# Patient Record
Sex: Female | Born: 1935 | Smoking: Never smoker
Health system: Southern US, Community
[De-identification: ages and names within clinical notes are randomized; demographics above are authoritative.]

## PROBLEM LIST (undated history)

## (undated) DIAGNOSIS — E7800 Pure hypercholesterolemia, unspecified: Secondary | ICD-10-CM

## (undated) DIAGNOSIS — E119 Type 2 diabetes mellitus without complications: Secondary | ICD-10-CM

## (undated) DIAGNOSIS — F0390 Unspecified dementia without behavioral disturbance: Secondary | ICD-10-CM

## (undated) DIAGNOSIS — I10 Essential (primary) hypertension: Secondary | ICD-10-CM

## (undated) HISTORY — PX: CHOLECYSTECTOMY: SHX55

---

## 2015-06-21 DIAGNOSIS — E1151 Type 2 diabetes mellitus with diabetic peripheral angiopathy without gangrene: Secondary | ICD-10-CM | POA: Diagnosis not present

## 2015-06-21 DIAGNOSIS — E1140 Type 2 diabetes mellitus with diabetic neuropathy, unspecified: Secondary | ICD-10-CM | POA: Diagnosis not present

## 2015-06-21 DIAGNOSIS — B351 Tinea unguium: Secondary | ICD-10-CM | POA: Diagnosis not present

## 2015-06-21 DIAGNOSIS — M79671 Pain in right foot: Secondary | ICD-10-CM | POA: Diagnosis not present

## 2015-07-04 DIAGNOSIS — E1165 Type 2 diabetes mellitus with hyperglycemia: Secondary | ICD-10-CM | POA: Diagnosis not present

## 2015-07-04 DIAGNOSIS — Z6826 Body mass index (BMI) 26.0-26.9, adult: Secondary | ICD-10-CM | POA: Diagnosis not present

## 2015-07-04 DIAGNOSIS — Z789 Other specified health status: Secondary | ICD-10-CM | POA: Diagnosis not present

## 2015-08-09 DIAGNOSIS — R262 Difficulty in walking, not elsewhere classified: Secondary | ICD-10-CM | POA: Diagnosis not present

## 2015-08-09 DIAGNOSIS — E7800 Pure hypercholesterolemia, unspecified: Secondary | ICD-10-CM | POA: Diagnosis not present

## 2015-08-09 DIAGNOSIS — I10 Essential (primary) hypertension: Secondary | ICD-10-CM | POA: Diagnosis not present

## 2015-08-09 DIAGNOSIS — E1165 Type 2 diabetes mellitus with hyperglycemia: Secondary | ICD-10-CM | POA: Diagnosis not present

## 2015-09-13 DIAGNOSIS — B351 Tinea unguium: Secondary | ICD-10-CM | POA: Diagnosis not present

## 2015-09-13 DIAGNOSIS — E1151 Type 2 diabetes mellitus with diabetic peripheral angiopathy without gangrene: Secondary | ICD-10-CM | POA: Diagnosis not present

## 2015-09-13 DIAGNOSIS — M79671 Pain in right foot: Secondary | ICD-10-CM | POA: Diagnosis not present

## 2015-09-13 DIAGNOSIS — E1140 Type 2 diabetes mellitus with diabetic neuropathy, unspecified: Secondary | ICD-10-CM | POA: Diagnosis not present

## 2015-09-30 DIAGNOSIS — E119 Type 2 diabetes mellitus without complications: Secondary | ICD-10-CM | POA: Diagnosis not present

## 2015-09-30 DIAGNOSIS — I10 Essential (primary) hypertension: Secondary | ICD-10-CM | POA: Diagnosis not present

## 2015-10-18 DIAGNOSIS — E7800 Pure hypercholesterolemia, unspecified: Secondary | ICD-10-CM | POA: Diagnosis not present

## 2015-10-18 DIAGNOSIS — N182 Chronic kidney disease, stage 2 (mild): Secondary | ICD-10-CM | POA: Diagnosis not present

## 2015-10-18 DIAGNOSIS — I10 Essential (primary) hypertension: Secondary | ICD-10-CM | POA: Diagnosis not present

## 2015-10-18 DIAGNOSIS — E1122 Type 2 diabetes mellitus with diabetic chronic kidney disease: Secondary | ICD-10-CM | POA: Diagnosis not present

## 2015-10-19 DIAGNOSIS — E119 Type 2 diabetes mellitus without complications: Secondary | ICD-10-CM | POA: Diagnosis not present

## 2015-10-19 DIAGNOSIS — I10 Essential (primary) hypertension: Secondary | ICD-10-CM | POA: Diagnosis not present

## 2015-11-24 DIAGNOSIS — I10 Essential (primary) hypertension: Secondary | ICD-10-CM | POA: Diagnosis not present

## 2015-11-24 DIAGNOSIS — E119 Type 2 diabetes mellitus without complications: Secondary | ICD-10-CM | POA: Diagnosis not present

## 2015-12-27 DIAGNOSIS — E1122 Type 2 diabetes mellitus with diabetic chronic kidney disease: Secondary | ICD-10-CM | POA: Diagnosis not present

## 2015-12-27 DIAGNOSIS — Z6826 Body mass index (BMI) 26.0-26.9, adult: Secondary | ICD-10-CM | POA: Diagnosis not present

## 2015-12-27 DIAGNOSIS — E559 Vitamin D deficiency, unspecified: Secondary | ICD-10-CM | POA: Diagnosis not present

## 2015-12-27 DIAGNOSIS — Z0000 Encounter for general adult medical examination without abnormal findings: Secondary | ICD-10-CM | POA: Diagnosis not present

## 2015-12-27 DIAGNOSIS — Z1389 Encounter for screening for other disorder: Secondary | ICD-10-CM | POA: Diagnosis not present

## 2015-12-27 DIAGNOSIS — Z299 Encounter for prophylactic measures, unspecified: Secondary | ICD-10-CM | POA: Diagnosis not present

## 2015-12-27 DIAGNOSIS — Z1211 Encounter for screening for malignant neoplasm of colon: Secondary | ICD-10-CM | POA: Diagnosis not present

## 2015-12-27 DIAGNOSIS — Z7189 Other specified counseling: Secondary | ICD-10-CM | POA: Diagnosis not present

## 2015-12-27 DIAGNOSIS — R5383 Other fatigue: Secondary | ICD-10-CM | POA: Diagnosis not present

## 2015-12-27 DIAGNOSIS — E7800 Pure hypercholesterolemia, unspecified: Secondary | ICD-10-CM | POA: Diagnosis not present

## 2015-12-30 DIAGNOSIS — I10 Essential (primary) hypertension: Secondary | ICD-10-CM | POA: Diagnosis not present

## 2015-12-30 DIAGNOSIS — E119 Type 2 diabetes mellitus without complications: Secondary | ICD-10-CM | POA: Diagnosis not present

## 2016-01-03 DIAGNOSIS — E1151 Type 2 diabetes mellitus with diabetic peripheral angiopathy without gangrene: Secondary | ICD-10-CM | POA: Diagnosis not present

## 2016-01-03 DIAGNOSIS — E1140 Type 2 diabetes mellitus with diabetic neuropathy, unspecified: Secondary | ICD-10-CM | POA: Diagnosis not present

## 2016-01-03 DIAGNOSIS — B351 Tinea unguium: Secondary | ICD-10-CM | POA: Diagnosis not present

## 2016-01-03 DIAGNOSIS — M79671 Pain in right foot: Secondary | ICD-10-CM | POA: Diagnosis not present

## 2016-02-07 DIAGNOSIS — I10 Essential (primary) hypertension: Secondary | ICD-10-CM | POA: Diagnosis not present

## 2016-02-07 DIAGNOSIS — E7800 Pure hypercholesterolemia, unspecified: Secondary | ICD-10-CM | POA: Diagnosis not present

## 2016-02-07 DIAGNOSIS — N182 Chronic kidney disease, stage 2 (mild): Secondary | ICD-10-CM | POA: Diagnosis not present

## 2016-02-07 DIAGNOSIS — E1122 Type 2 diabetes mellitus with diabetic chronic kidney disease: Secondary | ICD-10-CM | POA: Diagnosis not present

## 2016-03-06 DIAGNOSIS — Z23 Encounter for immunization: Secondary | ICD-10-CM | POA: Diagnosis not present

## 2016-04-11 DIAGNOSIS — E119 Type 2 diabetes mellitus without complications: Secondary | ICD-10-CM | POA: Diagnosis not present

## 2016-04-11 DIAGNOSIS — I10 Essential (primary) hypertension: Secondary | ICD-10-CM | POA: Diagnosis not present

## 2016-05-07 DIAGNOSIS — E119 Type 2 diabetes mellitus without complications: Secondary | ICD-10-CM | POA: Diagnosis not present

## 2016-05-07 DIAGNOSIS — I10 Essential (primary) hypertension: Secondary | ICD-10-CM | POA: Diagnosis not present

## 2016-05-08 DIAGNOSIS — E1140 Type 2 diabetes mellitus with diabetic neuropathy, unspecified: Secondary | ICD-10-CM | POA: Diagnosis not present

## 2016-05-08 DIAGNOSIS — E1151 Type 2 diabetes mellitus with diabetic peripheral angiopathy without gangrene: Secondary | ICD-10-CM | POA: Diagnosis not present

## 2016-05-08 DIAGNOSIS — M79671 Pain in right foot: Secondary | ICD-10-CM | POA: Diagnosis not present

## 2016-05-08 DIAGNOSIS — B351 Tinea unguium: Secondary | ICD-10-CM | POA: Diagnosis not present

## 2016-05-15 DIAGNOSIS — N182 Chronic kidney disease, stage 2 (mild): Secondary | ICD-10-CM | POA: Diagnosis not present

## 2016-05-15 DIAGNOSIS — Z6825 Body mass index (BMI) 25.0-25.9, adult: Secondary | ICD-10-CM | POA: Diagnosis not present

## 2016-05-15 DIAGNOSIS — Z299 Encounter for prophylactic measures, unspecified: Secondary | ICD-10-CM | POA: Diagnosis not present

## 2016-05-15 DIAGNOSIS — E7800 Pure hypercholesterolemia, unspecified: Secondary | ICD-10-CM | POA: Diagnosis not present

## 2016-05-15 DIAGNOSIS — E1122 Type 2 diabetes mellitus with diabetic chronic kidney disease: Secondary | ICD-10-CM | POA: Diagnosis not present

## 2016-06-12 DIAGNOSIS — I10 Essential (primary) hypertension: Secondary | ICD-10-CM | POA: Diagnosis not present

## 2016-06-12 DIAGNOSIS — E1122 Type 2 diabetes mellitus with diabetic chronic kidney disease: Secondary | ICD-10-CM | POA: Diagnosis not present

## 2016-06-12 DIAGNOSIS — E7800 Pure hypercholesterolemia, unspecified: Secondary | ICD-10-CM | POA: Diagnosis not present

## 2016-06-12 DIAGNOSIS — Z789 Other specified health status: Secondary | ICD-10-CM | POA: Diagnosis not present

## 2016-06-12 DIAGNOSIS — Z299 Encounter for prophylactic measures, unspecified: Secondary | ICD-10-CM | POA: Diagnosis not present

## 2016-06-12 DIAGNOSIS — J069 Acute upper respiratory infection, unspecified: Secondary | ICD-10-CM | POA: Diagnosis not present

## 2016-06-12 DIAGNOSIS — Z6825 Body mass index (BMI) 25.0-25.9, adult: Secondary | ICD-10-CM | POA: Diagnosis not present

## 2016-07-02 DIAGNOSIS — E119 Type 2 diabetes mellitus without complications: Secondary | ICD-10-CM | POA: Diagnosis not present

## 2016-07-02 DIAGNOSIS — I10 Essential (primary) hypertension: Secondary | ICD-10-CM | POA: Diagnosis not present

## 2016-07-25 DIAGNOSIS — I10 Essential (primary) hypertension: Secondary | ICD-10-CM | POA: Diagnosis not present

## 2016-07-25 DIAGNOSIS — E119 Type 2 diabetes mellitus without complications: Secondary | ICD-10-CM | POA: Diagnosis not present

## 2016-08-07 DIAGNOSIS — E1140 Type 2 diabetes mellitus with diabetic neuropathy, unspecified: Secondary | ICD-10-CM | POA: Diagnosis not present

## 2016-08-07 DIAGNOSIS — E1151 Type 2 diabetes mellitus with diabetic peripheral angiopathy without gangrene: Secondary | ICD-10-CM | POA: Diagnosis not present

## 2016-08-07 DIAGNOSIS — B351 Tinea unguium: Secondary | ICD-10-CM | POA: Diagnosis not present

## 2016-08-07 DIAGNOSIS — M79671 Pain in right foot: Secondary | ICD-10-CM | POA: Diagnosis not present

## 2016-08-21 DIAGNOSIS — Z789 Other specified health status: Secondary | ICD-10-CM | POA: Diagnosis not present

## 2016-08-21 DIAGNOSIS — Z6825 Body mass index (BMI) 25.0-25.9, adult: Secondary | ICD-10-CM | POA: Diagnosis not present

## 2016-08-21 DIAGNOSIS — Z299 Encounter for prophylactic measures, unspecified: Secondary | ICD-10-CM | POA: Diagnosis not present

## 2016-08-21 DIAGNOSIS — M810 Age-related osteoporosis without current pathological fracture: Secondary | ICD-10-CM | POA: Diagnosis not present

## 2016-08-21 DIAGNOSIS — K219 Gastro-esophageal reflux disease without esophagitis: Secondary | ICD-10-CM | POA: Diagnosis not present

## 2016-08-21 DIAGNOSIS — E1122 Type 2 diabetes mellitus with diabetic chronic kidney disease: Secondary | ICD-10-CM | POA: Diagnosis not present

## 2016-08-21 DIAGNOSIS — Z713 Dietary counseling and surveillance: Secondary | ICD-10-CM | POA: Diagnosis not present

## 2016-08-21 DIAGNOSIS — I10 Essential (primary) hypertension: Secondary | ICD-10-CM | POA: Diagnosis not present

## 2016-08-21 DIAGNOSIS — E7800 Pure hypercholesterolemia, unspecified: Secondary | ICD-10-CM | POA: Diagnosis not present

## 2016-08-21 DIAGNOSIS — N182 Chronic kidney disease, stage 2 (mild): Secondary | ICD-10-CM | POA: Diagnosis not present

## 2016-08-21 DIAGNOSIS — R32 Unspecified urinary incontinence: Secondary | ICD-10-CM | POA: Diagnosis not present

## 2016-10-23 DIAGNOSIS — E1151 Type 2 diabetes mellitus with diabetic peripheral angiopathy without gangrene: Secondary | ICD-10-CM | POA: Diagnosis not present

## 2016-10-23 DIAGNOSIS — E1140 Type 2 diabetes mellitus with diabetic neuropathy, unspecified: Secondary | ICD-10-CM | POA: Diagnosis not present

## 2016-10-23 DIAGNOSIS — B351 Tinea unguium: Secondary | ICD-10-CM | POA: Diagnosis not present

## 2016-10-23 DIAGNOSIS — M79671 Pain in right foot: Secondary | ICD-10-CM | POA: Diagnosis not present

## 2016-11-27 DIAGNOSIS — Z6825 Body mass index (BMI) 25.0-25.9, adult: Secondary | ICD-10-CM | POA: Diagnosis not present

## 2016-11-27 DIAGNOSIS — E1165 Type 2 diabetes mellitus with hyperglycemia: Secondary | ICD-10-CM | POA: Diagnosis not present

## 2016-11-27 DIAGNOSIS — I10 Essential (primary) hypertension: Secondary | ICD-10-CM | POA: Diagnosis not present

## 2016-11-27 DIAGNOSIS — E7800 Pure hypercholesterolemia, unspecified: Secondary | ICD-10-CM | POA: Diagnosis not present

## 2016-11-27 DIAGNOSIS — M810 Age-related osteoporosis without current pathological fracture: Secondary | ICD-10-CM | POA: Diagnosis not present

## 2016-11-27 DIAGNOSIS — Z299 Encounter for prophylactic measures, unspecified: Secondary | ICD-10-CM | POA: Diagnosis not present

## 2017-01-16 DIAGNOSIS — I10 Essential (primary) hypertension: Secondary | ICD-10-CM | POA: Diagnosis not present

## 2017-01-16 DIAGNOSIS — Z1389 Encounter for screening for other disorder: Secondary | ICD-10-CM | POA: Diagnosis not present

## 2017-01-16 DIAGNOSIS — Z7189 Other specified counseling: Secondary | ICD-10-CM | POA: Diagnosis not present

## 2017-01-16 DIAGNOSIS — Z79899 Other long term (current) drug therapy: Secondary | ICD-10-CM | POA: Diagnosis not present

## 2017-01-16 DIAGNOSIS — Z0000 Encounter for general adult medical examination without abnormal findings: Secondary | ICD-10-CM | POA: Diagnosis not present

## 2017-01-16 DIAGNOSIS — Z6824 Body mass index (BMI) 24.0-24.9, adult: Secondary | ICD-10-CM | POA: Diagnosis not present

## 2017-01-16 DIAGNOSIS — Z299 Encounter for prophylactic measures, unspecified: Secondary | ICD-10-CM | POA: Diagnosis not present

## 2017-01-16 DIAGNOSIS — Z1211 Encounter for screening for malignant neoplasm of colon: Secondary | ICD-10-CM | POA: Diagnosis not present

## 2017-01-16 DIAGNOSIS — E7800 Pure hypercholesterolemia, unspecified: Secondary | ICD-10-CM | POA: Diagnosis not present

## 2017-01-16 DIAGNOSIS — E559 Vitamin D deficiency, unspecified: Secondary | ICD-10-CM | POA: Diagnosis not present

## 2017-01-16 DIAGNOSIS — R5383 Other fatigue: Secondary | ICD-10-CM | POA: Diagnosis not present

## 2017-01-16 DIAGNOSIS — E1165 Type 2 diabetes mellitus with hyperglycemia: Secondary | ICD-10-CM | POA: Diagnosis not present

## 2017-01-24 DIAGNOSIS — I10 Essential (primary) hypertension: Secondary | ICD-10-CM | POA: Diagnosis not present

## 2017-01-24 DIAGNOSIS — E119 Type 2 diabetes mellitus without complications: Secondary | ICD-10-CM | POA: Diagnosis not present

## 2017-01-29 DIAGNOSIS — M79671 Pain in right foot: Secondary | ICD-10-CM | POA: Diagnosis not present

## 2017-01-29 DIAGNOSIS — E1151 Type 2 diabetes mellitus with diabetic peripheral angiopathy without gangrene: Secondary | ICD-10-CM | POA: Diagnosis not present

## 2017-01-29 DIAGNOSIS — B351 Tinea unguium: Secondary | ICD-10-CM | POA: Diagnosis not present

## 2017-01-29 DIAGNOSIS — E1140 Type 2 diabetes mellitus with diabetic neuropathy, unspecified: Secondary | ICD-10-CM | POA: Diagnosis not present

## 2017-02-02 DIAGNOSIS — Z79899 Other long term (current) drug therapy: Secondary | ICD-10-CM | POA: Diagnosis not present

## 2017-02-02 DIAGNOSIS — S52502A Unspecified fracture of the lower end of left radius, initial encounter for closed fracture: Secondary | ICD-10-CM | POA: Diagnosis not present

## 2017-02-02 DIAGNOSIS — S52592A Other fractures of lower end of left radius, initial encounter for closed fracture: Secondary | ICD-10-CM | POA: Diagnosis not present

## 2017-02-02 DIAGNOSIS — E7800 Pure hypercholesterolemia, unspecified: Secondary | ICD-10-CM | POA: Diagnosis not present

## 2017-02-02 DIAGNOSIS — W1839XA Other fall on same level, initial encounter: Secondary | ICD-10-CM | POA: Diagnosis not present

## 2017-02-02 DIAGNOSIS — K219 Gastro-esophageal reflux disease without esophagitis: Secondary | ICD-10-CM | POA: Diagnosis not present

## 2017-02-02 DIAGNOSIS — Z7984 Long term (current) use of oral hypoglycemic drugs: Secondary | ICD-10-CM | POA: Diagnosis not present

## 2017-02-02 DIAGNOSIS — I10 Essential (primary) hypertension: Secondary | ICD-10-CM | POA: Diagnosis not present

## 2017-02-02 DIAGNOSIS — S62102A Fracture of unspecified carpal bone, left wrist, initial encounter for closed fracture: Secondary | ICD-10-CM | POA: Diagnosis not present

## 2017-02-02 DIAGNOSIS — E119 Type 2 diabetes mellitus without complications: Secondary | ICD-10-CM | POA: Diagnosis not present

## 2017-02-05 DIAGNOSIS — K219 Gastro-esophageal reflux disease without esophagitis: Secondary | ICD-10-CM | POA: Diagnosis not present

## 2017-02-05 DIAGNOSIS — E119 Type 2 diabetes mellitus without complications: Secondary | ICD-10-CM | POA: Diagnosis not present

## 2017-02-05 DIAGNOSIS — S5292XA Unspecified fracture of left forearm, initial encounter for closed fracture: Secondary | ICD-10-CM | POA: Diagnosis not present

## 2017-02-05 DIAGNOSIS — S52502A Unspecified fracture of the lower end of left radius, initial encounter for closed fracture: Secondary | ICD-10-CM | POA: Diagnosis not present

## 2017-02-05 DIAGNOSIS — Z794 Long term (current) use of insulin: Secondary | ICD-10-CM | POA: Diagnosis not present

## 2017-02-05 DIAGNOSIS — Z79899 Other long term (current) drug therapy: Secondary | ICD-10-CM | POA: Diagnosis not present

## 2017-02-05 DIAGNOSIS — I10 Essential (primary) hypertension: Secondary | ICD-10-CM | POA: Diagnosis not present

## 2017-02-05 DIAGNOSIS — S52532A Colles' fracture of left radius, initial encounter for closed fracture: Secondary | ICD-10-CM | POA: Diagnosis not present

## 2017-02-05 DIAGNOSIS — E7800 Pure hypercholesterolemia, unspecified: Secondary | ICD-10-CM | POA: Diagnosis not present

## 2017-02-05 DIAGNOSIS — W1839XA Other fall on same level, initial encounter: Secondary | ICD-10-CM | POA: Diagnosis not present

## 2017-02-12 DIAGNOSIS — S52602D Unspecified fracture of lower end of left ulna, subsequent encounter for closed fracture with routine healing: Secondary | ICD-10-CM | POA: Diagnosis not present

## 2017-02-12 DIAGNOSIS — S52502D Unspecified fracture of the lower end of left radius, subsequent encounter for closed fracture with routine healing: Secondary | ICD-10-CM | POA: Diagnosis not present

## 2017-02-14 DIAGNOSIS — E119 Type 2 diabetes mellitus without complications: Secondary | ICD-10-CM | POA: Diagnosis not present

## 2017-02-14 DIAGNOSIS — I10 Essential (primary) hypertension: Secondary | ICD-10-CM | POA: Diagnosis not present

## 2017-03-05 DIAGNOSIS — S52602D Unspecified fracture of lower end of left ulna, subsequent encounter for closed fracture with routine healing: Secondary | ICD-10-CM | POA: Diagnosis not present

## 2017-03-05 DIAGNOSIS — S52502D Unspecified fracture of the lower end of left radius, subsequent encounter for closed fracture with routine healing: Secondary | ICD-10-CM | POA: Diagnosis not present

## 2017-03-12 DIAGNOSIS — Z23 Encounter for immunization: Secondary | ICD-10-CM | POA: Diagnosis not present

## 2017-04-09 DIAGNOSIS — S52602D Unspecified fracture of lower end of left ulna, subsequent encounter for closed fracture with routine healing: Secondary | ICD-10-CM | POA: Diagnosis not present

## 2017-04-09 DIAGNOSIS — S52502D Unspecified fracture of the lower end of left radius, subsequent encounter for closed fracture with routine healing: Secondary | ICD-10-CM | POA: Diagnosis not present

## 2017-04-11 DIAGNOSIS — I10 Essential (primary) hypertension: Secondary | ICD-10-CM | POA: Diagnosis not present

## 2017-04-11 DIAGNOSIS — E119 Type 2 diabetes mellitus without complications: Secondary | ICD-10-CM | POA: Diagnosis not present

## 2017-04-23 DIAGNOSIS — B351 Tinea unguium: Secondary | ICD-10-CM | POA: Diagnosis not present

## 2017-04-23 DIAGNOSIS — E1151 Type 2 diabetes mellitus with diabetic peripheral angiopathy without gangrene: Secondary | ICD-10-CM | POA: Diagnosis not present

## 2017-04-23 DIAGNOSIS — M79671 Pain in right foot: Secondary | ICD-10-CM | POA: Diagnosis not present

## 2017-04-23 DIAGNOSIS — E1140 Type 2 diabetes mellitus with diabetic neuropathy, unspecified: Secondary | ICD-10-CM | POA: Diagnosis not present

## 2017-04-30 DIAGNOSIS — Z299 Encounter for prophylactic measures, unspecified: Secondary | ICD-10-CM | POA: Diagnosis not present

## 2017-04-30 DIAGNOSIS — Z6823 Body mass index (BMI) 23.0-23.9, adult: Secondary | ICD-10-CM | POA: Diagnosis not present

## 2017-04-30 DIAGNOSIS — E1165 Type 2 diabetes mellitus with hyperglycemia: Secondary | ICD-10-CM | POA: Diagnosis not present

## 2017-04-30 DIAGNOSIS — Z713 Dietary counseling and surveillance: Secondary | ICD-10-CM | POA: Diagnosis not present

## 2017-05-10 DIAGNOSIS — I10 Essential (primary) hypertension: Secondary | ICD-10-CM | POA: Diagnosis not present

## 2017-05-10 DIAGNOSIS — E119 Type 2 diabetes mellitus without complications: Secondary | ICD-10-CM | POA: Diagnosis not present

## 2017-06-12 DIAGNOSIS — E119 Type 2 diabetes mellitus without complications: Secondary | ICD-10-CM | POA: Diagnosis not present

## 2017-06-12 DIAGNOSIS — I10 Essential (primary) hypertension: Secondary | ICD-10-CM | POA: Diagnosis not present

## 2017-07-29 DIAGNOSIS — E119 Type 2 diabetes mellitus without complications: Secondary | ICD-10-CM | POA: Diagnosis not present

## 2017-07-29 DIAGNOSIS — I10 Essential (primary) hypertension: Secondary | ICD-10-CM | POA: Diagnosis not present

## 2017-07-30 DIAGNOSIS — E1151 Type 2 diabetes mellitus with diabetic peripheral angiopathy without gangrene: Secondary | ICD-10-CM | POA: Diagnosis not present

## 2017-07-30 DIAGNOSIS — B351 Tinea unguium: Secondary | ICD-10-CM | POA: Diagnosis not present

## 2017-07-30 DIAGNOSIS — M79671 Pain in right foot: Secondary | ICD-10-CM | POA: Diagnosis not present

## 2017-07-30 DIAGNOSIS — E1140 Type 2 diabetes mellitus with diabetic neuropathy, unspecified: Secondary | ICD-10-CM | POA: Diagnosis not present

## 2017-08-06 DIAGNOSIS — Z6823 Body mass index (BMI) 23.0-23.9, adult: Secondary | ICD-10-CM | POA: Diagnosis not present

## 2017-08-06 DIAGNOSIS — E1165 Type 2 diabetes mellitus with hyperglycemia: Secondary | ICD-10-CM | POA: Diagnosis not present

## 2017-08-06 DIAGNOSIS — Z789 Other specified health status: Secondary | ICD-10-CM | POA: Diagnosis not present

## 2017-08-06 DIAGNOSIS — I10 Essential (primary) hypertension: Secondary | ICD-10-CM | POA: Diagnosis not present

## 2017-08-06 DIAGNOSIS — Z299 Encounter for prophylactic measures, unspecified: Secondary | ICD-10-CM | POA: Diagnosis not present

## 2017-08-06 DIAGNOSIS — E7800 Pure hypercholesterolemia, unspecified: Secondary | ICD-10-CM | POA: Diagnosis not present

## 2017-09-05 DIAGNOSIS — E119 Type 2 diabetes mellitus without complications: Secondary | ICD-10-CM | POA: Diagnosis not present

## 2017-09-05 DIAGNOSIS — I10 Essential (primary) hypertension: Secondary | ICD-10-CM | POA: Diagnosis not present

## 2017-09-30 DIAGNOSIS — J069 Acute upper respiratory infection, unspecified: Secondary | ICD-10-CM | POA: Diagnosis not present

## 2017-09-30 DIAGNOSIS — E1165 Type 2 diabetes mellitus with hyperglycemia: Secondary | ICD-10-CM | POA: Diagnosis not present

## 2017-09-30 DIAGNOSIS — I10 Essential (primary) hypertension: Secondary | ICD-10-CM | POA: Diagnosis not present

## 2017-09-30 DIAGNOSIS — Z6824 Body mass index (BMI) 24.0-24.9, adult: Secondary | ICD-10-CM | POA: Diagnosis not present

## 2017-09-30 DIAGNOSIS — Z299 Encounter for prophylactic measures, unspecified: Secondary | ICD-10-CM | POA: Diagnosis not present

## 2017-10-29 DIAGNOSIS — M79671 Pain in right foot: Secondary | ICD-10-CM | POA: Diagnosis not present

## 2017-10-29 DIAGNOSIS — E1140 Type 2 diabetes mellitus with diabetic neuropathy, unspecified: Secondary | ICD-10-CM | POA: Diagnosis not present

## 2017-10-29 DIAGNOSIS — B351 Tinea unguium: Secondary | ICD-10-CM | POA: Diagnosis not present

## 2017-10-29 DIAGNOSIS — E1151 Type 2 diabetes mellitus with diabetic peripheral angiopathy without gangrene: Secondary | ICD-10-CM | POA: Diagnosis not present

## 2017-11-05 DIAGNOSIS — Z6824 Body mass index (BMI) 24.0-24.9, adult: Secondary | ICD-10-CM | POA: Diagnosis not present

## 2017-11-05 DIAGNOSIS — Z299 Encounter for prophylactic measures, unspecified: Secondary | ICD-10-CM | POA: Diagnosis not present

## 2017-11-05 DIAGNOSIS — I10 Essential (primary) hypertension: Secondary | ICD-10-CM | POA: Diagnosis not present

## 2017-11-05 DIAGNOSIS — Z789 Other specified health status: Secondary | ICD-10-CM | POA: Diagnosis not present

## 2017-11-05 DIAGNOSIS — J069 Acute upper respiratory infection, unspecified: Secondary | ICD-10-CM | POA: Diagnosis not present

## 2017-11-12 DIAGNOSIS — Z713 Dietary counseling and surveillance: Secondary | ICD-10-CM | POA: Diagnosis not present

## 2017-11-12 DIAGNOSIS — E1165 Type 2 diabetes mellitus with hyperglycemia: Secondary | ICD-10-CM | POA: Diagnosis not present

## 2017-11-12 DIAGNOSIS — I10 Essential (primary) hypertension: Secondary | ICD-10-CM | POA: Diagnosis not present

## 2017-11-12 DIAGNOSIS — Z299 Encounter for prophylactic measures, unspecified: Secondary | ICD-10-CM | POA: Diagnosis not present

## 2017-11-12 DIAGNOSIS — Z6824 Body mass index (BMI) 24.0-24.9, adult: Secondary | ICD-10-CM | POA: Diagnosis not present

## 2017-11-29 DIAGNOSIS — I10 Essential (primary) hypertension: Secondary | ICD-10-CM | POA: Diagnosis not present

## 2017-11-29 DIAGNOSIS — E119 Type 2 diabetes mellitus without complications: Secondary | ICD-10-CM | POA: Diagnosis not present

## 2017-12-27 DIAGNOSIS — E119 Type 2 diabetes mellitus without complications: Secondary | ICD-10-CM | POA: Diagnosis not present

## 2017-12-27 DIAGNOSIS — I10 Essential (primary) hypertension: Secondary | ICD-10-CM | POA: Diagnosis not present

## 2018-01-14 DIAGNOSIS — E1140 Type 2 diabetes mellitus with diabetic neuropathy, unspecified: Secondary | ICD-10-CM | POA: Diagnosis not present

## 2018-01-14 DIAGNOSIS — M79671 Pain in right foot: Secondary | ICD-10-CM | POA: Diagnosis not present

## 2018-01-14 DIAGNOSIS — E1151 Type 2 diabetes mellitus with diabetic peripheral angiopathy without gangrene: Secondary | ICD-10-CM | POA: Diagnosis not present

## 2018-01-14 DIAGNOSIS — B351 Tinea unguium: Secondary | ICD-10-CM | POA: Diagnosis not present

## 2018-01-17 DIAGNOSIS — Z299 Encounter for prophylactic measures, unspecified: Secondary | ICD-10-CM | POA: Diagnosis not present

## 2018-01-17 DIAGNOSIS — Z1331 Encounter for screening for depression: Secondary | ICD-10-CM | POA: Diagnosis not present

## 2018-01-17 DIAGNOSIS — Z0000 Encounter for general adult medical examination without abnormal findings: Secondary | ICD-10-CM | POA: Diagnosis not present

## 2018-01-17 DIAGNOSIS — Z1339 Encounter for screening examination for other mental health and behavioral disorders: Secondary | ICD-10-CM | POA: Diagnosis not present

## 2018-01-17 DIAGNOSIS — Z7189 Other specified counseling: Secondary | ICD-10-CM | POA: Diagnosis not present

## 2018-01-17 DIAGNOSIS — R5383 Other fatigue: Secondary | ICD-10-CM | POA: Diagnosis not present

## 2018-01-17 DIAGNOSIS — Z6823 Body mass index (BMI) 23.0-23.9, adult: Secondary | ICD-10-CM | POA: Diagnosis not present

## 2018-01-17 DIAGNOSIS — E559 Vitamin D deficiency, unspecified: Secondary | ICD-10-CM | POA: Diagnosis not present

## 2018-01-17 DIAGNOSIS — E7800 Pure hypercholesterolemia, unspecified: Secondary | ICD-10-CM | POA: Diagnosis not present

## 2018-01-17 DIAGNOSIS — Z79899 Other long term (current) drug therapy: Secondary | ICD-10-CM | POA: Diagnosis not present

## 2018-01-24 DIAGNOSIS — I10 Essential (primary) hypertension: Secondary | ICD-10-CM | POA: Diagnosis not present

## 2018-01-24 DIAGNOSIS — E119 Type 2 diabetes mellitus without complications: Secondary | ICD-10-CM | POA: Diagnosis not present

## 2018-01-28 DIAGNOSIS — E2839 Other primary ovarian failure: Secondary | ICD-10-CM | POA: Diagnosis not present

## 2018-02-11 DIAGNOSIS — E119 Type 2 diabetes mellitus without complications: Secondary | ICD-10-CM | POA: Diagnosis not present

## 2018-02-11 DIAGNOSIS — Z7984 Long term (current) use of oral hypoglycemic drugs: Secondary | ICD-10-CM | POA: Diagnosis not present

## 2018-02-11 DIAGNOSIS — Z794 Long term (current) use of insulin: Secondary | ICD-10-CM | POA: Diagnosis not present

## 2018-02-11 DIAGNOSIS — H25813 Combined forms of age-related cataract, bilateral: Secondary | ICD-10-CM | POA: Diagnosis not present

## 2018-02-25 DIAGNOSIS — I10 Essential (primary) hypertension: Secondary | ICD-10-CM | POA: Diagnosis not present

## 2018-02-25 DIAGNOSIS — M79606 Pain in leg, unspecified: Secondary | ICD-10-CM | POA: Diagnosis not present

## 2018-02-25 DIAGNOSIS — Z6824 Body mass index (BMI) 24.0-24.9, adult: Secondary | ICD-10-CM | POA: Diagnosis not present

## 2018-02-25 DIAGNOSIS — Z23 Encounter for immunization: Secondary | ICD-10-CM | POA: Diagnosis not present

## 2018-02-25 DIAGNOSIS — G309 Alzheimer's disease, unspecified: Secondary | ICD-10-CM | POA: Diagnosis not present

## 2018-02-25 DIAGNOSIS — F0280 Dementia in other diseases classified elsewhere without behavioral disturbance: Secondary | ICD-10-CM | POA: Diagnosis not present

## 2018-02-25 DIAGNOSIS — E1165 Type 2 diabetes mellitus with hyperglycemia: Secondary | ICD-10-CM | POA: Diagnosis not present

## 2018-02-25 DIAGNOSIS — Z299 Encounter for prophylactic measures, unspecified: Secondary | ICD-10-CM | POA: Diagnosis not present

## 2018-04-08 DIAGNOSIS — I10 Essential (primary) hypertension: Secondary | ICD-10-CM | POA: Diagnosis not present

## 2018-04-08 DIAGNOSIS — G309 Alzheimer's disease, unspecified: Secondary | ICD-10-CM | POA: Diagnosis not present

## 2018-04-08 DIAGNOSIS — Z299 Encounter for prophylactic measures, unspecified: Secondary | ICD-10-CM | POA: Diagnosis not present

## 2018-04-08 DIAGNOSIS — Z6824 Body mass index (BMI) 24.0-24.9, adult: Secondary | ICD-10-CM | POA: Diagnosis not present

## 2018-04-08 DIAGNOSIS — E1165 Type 2 diabetes mellitus with hyperglycemia: Secondary | ICD-10-CM | POA: Diagnosis not present

## 2018-04-08 DIAGNOSIS — F0280 Dementia in other diseases classified elsewhere without behavioral disturbance: Secondary | ICD-10-CM | POA: Diagnosis not present

## 2018-04-10 DIAGNOSIS — E119 Type 2 diabetes mellitus without complications: Secondary | ICD-10-CM | POA: Diagnosis not present

## 2018-04-10 DIAGNOSIS — I10 Essential (primary) hypertension: Secondary | ICD-10-CM | POA: Diagnosis not present

## 2018-04-22 DIAGNOSIS — E1140 Type 2 diabetes mellitus with diabetic neuropathy, unspecified: Secondary | ICD-10-CM | POA: Diagnosis not present

## 2018-04-22 DIAGNOSIS — E1151 Type 2 diabetes mellitus with diabetic peripheral angiopathy without gangrene: Secondary | ICD-10-CM | POA: Diagnosis not present

## 2018-04-22 DIAGNOSIS — B351 Tinea unguium: Secondary | ICD-10-CM | POA: Diagnosis not present

## 2018-04-22 DIAGNOSIS — M79671 Pain in right foot: Secondary | ICD-10-CM | POA: Diagnosis not present

## 2018-05-14 DIAGNOSIS — I10 Essential (primary) hypertension: Secondary | ICD-10-CM | POA: Diagnosis not present

## 2018-05-14 DIAGNOSIS — E119 Type 2 diabetes mellitus without complications: Secondary | ICD-10-CM | POA: Diagnosis not present

## 2018-06-03 DIAGNOSIS — E7800 Pure hypercholesterolemia, unspecified: Secondary | ICD-10-CM | POA: Diagnosis not present

## 2018-06-03 DIAGNOSIS — I10 Essential (primary) hypertension: Secondary | ICD-10-CM | POA: Diagnosis not present

## 2018-06-03 DIAGNOSIS — Z794 Long term (current) use of insulin: Secondary | ICD-10-CM | POA: Diagnosis not present

## 2018-06-03 DIAGNOSIS — E119 Type 2 diabetes mellitus without complications: Secondary | ICD-10-CM | POA: Diagnosis not present

## 2018-06-03 DIAGNOSIS — T671XXA Heat syncope, initial encounter: Secondary | ICD-10-CM | POA: Diagnosis not present

## 2018-06-03 DIAGNOSIS — R42 Dizziness and giddiness: Secondary | ICD-10-CM | POA: Diagnosis not present

## 2018-06-03 DIAGNOSIS — R55 Syncope and collapse: Secondary | ICD-10-CM | POA: Diagnosis not present

## 2018-06-24 DIAGNOSIS — J309 Allergic rhinitis, unspecified: Secondary | ICD-10-CM | POA: Diagnosis not present

## 2018-06-24 DIAGNOSIS — R413 Other amnesia: Secondary | ICD-10-CM | POA: Diagnosis not present

## 2018-06-24 DIAGNOSIS — M1710 Unilateral primary osteoarthritis, unspecified knee: Secondary | ICD-10-CM | POA: Diagnosis not present

## 2018-06-24 DIAGNOSIS — Z6823 Body mass index (BMI) 23.0-23.9, adult: Secondary | ICD-10-CM | POA: Diagnosis not present

## 2018-06-24 DIAGNOSIS — E1165 Type 2 diabetes mellitus with hyperglycemia: Secondary | ICD-10-CM | POA: Diagnosis not present

## 2018-06-24 DIAGNOSIS — Z299 Encounter for prophylactic measures, unspecified: Secondary | ICD-10-CM | POA: Diagnosis not present

## 2018-06-24 DIAGNOSIS — Z789 Other specified health status: Secondary | ICD-10-CM | POA: Diagnosis not present

## 2018-06-25 DIAGNOSIS — E119 Type 2 diabetes mellitus without complications: Secondary | ICD-10-CM | POA: Diagnosis not present

## 2018-06-25 DIAGNOSIS — I10 Essential (primary) hypertension: Secondary | ICD-10-CM | POA: Diagnosis not present

## 2018-07-03 DIAGNOSIS — G309 Alzheimer's disease, unspecified: Secondary | ICD-10-CM | POA: Diagnosis not present

## 2018-07-03 DIAGNOSIS — F0280 Dementia in other diseases classified elsewhere without behavioral disturbance: Secondary | ICD-10-CM | POA: Diagnosis not present

## 2018-07-03 DIAGNOSIS — R413 Other amnesia: Secondary | ICD-10-CM | POA: Diagnosis not present

## 2018-07-11 DIAGNOSIS — Z794 Long term (current) use of insulin: Secondary | ICD-10-CM | POA: Diagnosis not present

## 2018-07-11 DIAGNOSIS — R404 Transient alteration of awareness: Secondary | ICD-10-CM | POA: Diagnosis not present

## 2018-07-11 DIAGNOSIS — K219 Gastro-esophageal reflux disease without esophagitis: Secondary | ICD-10-CM | POA: Diagnosis not present

## 2018-07-11 DIAGNOSIS — W228XXA Striking against or struck by other objects, initial encounter: Secondary | ICD-10-CM | POA: Diagnosis not present

## 2018-07-11 DIAGNOSIS — W19XXXA Unspecified fall, initial encounter: Secondary | ICD-10-CM | POA: Diagnosis not present

## 2018-07-11 DIAGNOSIS — E860 Dehydration: Secondary | ICD-10-CM | POA: Diagnosis not present

## 2018-07-11 DIAGNOSIS — I10 Essential (primary) hypertension: Secondary | ICD-10-CM | POA: Diagnosis not present

## 2018-07-11 DIAGNOSIS — T671XXA Heat syncope, initial encounter: Secondary | ICD-10-CM | POA: Diagnosis not present

## 2018-07-11 DIAGNOSIS — R55 Syncope and collapse: Secondary | ICD-10-CM | POA: Diagnosis not present

## 2018-07-11 DIAGNOSIS — R42 Dizziness and giddiness: Secondary | ICD-10-CM | POA: Diagnosis not present

## 2018-07-11 DIAGNOSIS — E119 Type 2 diabetes mellitus without complications: Secondary | ICD-10-CM | POA: Diagnosis not present

## 2018-07-11 DIAGNOSIS — E7800 Pure hypercholesterolemia, unspecified: Secondary | ICD-10-CM | POA: Diagnosis not present

## 2018-07-11 DIAGNOSIS — Z79899 Other long term (current) drug therapy: Secondary | ICD-10-CM | POA: Diagnosis not present

## 2018-07-14 DIAGNOSIS — Z789 Other specified health status: Secondary | ICD-10-CM | POA: Diagnosis not present

## 2018-07-14 DIAGNOSIS — F0280 Dementia in other diseases classified elsewhere without behavioral disturbance: Secondary | ICD-10-CM | POA: Diagnosis not present

## 2018-07-14 DIAGNOSIS — J069 Acute upper respiratory infection, unspecified: Secondary | ICD-10-CM | POA: Diagnosis not present

## 2018-07-14 DIAGNOSIS — E1165 Type 2 diabetes mellitus with hyperglycemia: Secondary | ICD-10-CM | POA: Diagnosis not present

## 2018-07-14 DIAGNOSIS — G309 Alzheimer's disease, unspecified: Secondary | ICD-10-CM | POA: Diagnosis not present

## 2018-07-14 DIAGNOSIS — I10 Essential (primary) hypertension: Secondary | ICD-10-CM | POA: Diagnosis not present

## 2018-07-14 DIAGNOSIS — Z6823 Body mass index (BMI) 23.0-23.9, adult: Secondary | ICD-10-CM | POA: Diagnosis not present

## 2018-07-14 DIAGNOSIS — Z299 Encounter for prophylactic measures, unspecified: Secondary | ICD-10-CM | POA: Diagnosis not present

## 2018-07-18 DIAGNOSIS — E7800 Pure hypercholesterolemia, unspecified: Secondary | ICD-10-CM | POA: Diagnosis not present

## 2018-07-18 DIAGNOSIS — R05 Cough: Secondary | ICD-10-CM | POA: Diagnosis not present

## 2018-07-18 DIAGNOSIS — G309 Alzheimer's disease, unspecified: Secondary | ICD-10-CM | POA: Diagnosis not present

## 2018-07-18 DIAGNOSIS — K2270 Barrett's esophagus without dysplasia: Secondary | ICD-10-CM | POA: Diagnosis not present

## 2018-07-18 DIAGNOSIS — Z794 Long term (current) use of insulin: Secondary | ICD-10-CM | POA: Diagnosis not present

## 2018-07-18 DIAGNOSIS — F0280 Dementia in other diseases classified elsewhere without behavioral disturbance: Secondary | ICD-10-CM | POA: Diagnosis not present

## 2018-07-18 DIAGNOSIS — R0602 Shortness of breath: Secondary | ICD-10-CM | POA: Diagnosis not present

## 2018-07-18 DIAGNOSIS — I10 Essential (primary) hypertension: Secondary | ICD-10-CM | POA: Diagnosis not present

## 2018-07-18 DIAGNOSIS — E860 Dehydration: Secondary | ICD-10-CM | POA: Diagnosis not present

## 2018-07-18 DIAGNOSIS — K219 Gastro-esophageal reflux disease without esophagitis: Secondary | ICD-10-CM | POA: Diagnosis not present

## 2018-07-18 DIAGNOSIS — R42 Dizziness and giddiness: Secondary | ICD-10-CM | POA: Diagnosis not present

## 2018-07-18 DIAGNOSIS — E1165 Type 2 diabetes mellitus with hyperglycemia: Secondary | ICD-10-CM | POA: Diagnosis not present

## 2018-07-18 DIAGNOSIS — Z9049 Acquired absence of other specified parts of digestive tract: Secondary | ICD-10-CM | POA: Diagnosis not present

## 2018-07-18 DIAGNOSIS — R000 Tachycardia, unspecified: Secondary | ICD-10-CM | POA: Diagnosis not present

## 2018-07-18 DIAGNOSIS — Z79899 Other long term (current) drug therapy: Secondary | ICD-10-CM | POA: Diagnosis not present

## 2018-07-19 DIAGNOSIS — R05 Cough: Secondary | ICD-10-CM | POA: Diagnosis not present

## 2018-07-19 DIAGNOSIS — R0602 Shortness of breath: Secondary | ICD-10-CM | POA: Diagnosis not present

## 2018-07-22 DIAGNOSIS — M79671 Pain in right foot: Secondary | ICD-10-CM | POA: Diagnosis not present

## 2018-07-22 DIAGNOSIS — M79672 Pain in left foot: Secondary | ICD-10-CM | POA: Diagnosis not present

## 2018-07-22 DIAGNOSIS — E1151 Type 2 diabetes mellitus with diabetic peripheral angiopathy without gangrene: Secondary | ICD-10-CM | POA: Diagnosis not present

## 2018-07-22 DIAGNOSIS — E1140 Type 2 diabetes mellitus with diabetic neuropathy, unspecified: Secondary | ICD-10-CM | POA: Diagnosis not present

## 2018-07-22 DIAGNOSIS — B351 Tinea unguium: Secondary | ICD-10-CM | POA: Diagnosis not present

## 2018-07-23 DIAGNOSIS — I10 Essential (primary) hypertension: Secondary | ICD-10-CM | POA: Diagnosis not present

## 2018-07-23 DIAGNOSIS — E119 Type 2 diabetes mellitus without complications: Secondary | ICD-10-CM | POA: Diagnosis not present

## 2018-09-12 DIAGNOSIS — E119 Type 2 diabetes mellitus without complications: Secondary | ICD-10-CM | POA: Diagnosis not present

## 2018-09-12 DIAGNOSIS — I10 Essential (primary) hypertension: Secondary | ICD-10-CM | POA: Diagnosis not present

## 2018-10-01 DIAGNOSIS — F0280 Dementia in other diseases classified elsewhere without behavioral disturbance: Secondary | ICD-10-CM | POA: Diagnosis not present

## 2018-10-01 DIAGNOSIS — Z299 Encounter for prophylactic measures, unspecified: Secondary | ICD-10-CM | POA: Diagnosis not present

## 2018-10-01 DIAGNOSIS — Z6823 Body mass index (BMI) 23.0-23.9, adult: Secondary | ICD-10-CM | POA: Diagnosis not present

## 2018-10-01 DIAGNOSIS — I10 Essential (primary) hypertension: Secondary | ICD-10-CM | POA: Diagnosis not present

## 2018-10-01 DIAGNOSIS — E1165 Type 2 diabetes mellitus with hyperglycemia: Secondary | ICD-10-CM | POA: Diagnosis not present

## 2018-10-01 DIAGNOSIS — G309 Alzheimer's disease, unspecified: Secondary | ICD-10-CM | POA: Diagnosis not present

## 2018-10-07 DIAGNOSIS — B351 Tinea unguium: Secondary | ICD-10-CM | POA: Diagnosis not present

## 2018-10-07 DIAGNOSIS — E1140 Type 2 diabetes mellitus with diabetic neuropathy, unspecified: Secondary | ICD-10-CM | POA: Diagnosis not present

## 2018-10-07 DIAGNOSIS — E1151 Type 2 diabetes mellitus with diabetic peripheral angiopathy without gangrene: Secondary | ICD-10-CM | POA: Diagnosis not present

## 2018-10-07 DIAGNOSIS — M79672 Pain in left foot: Secondary | ICD-10-CM | POA: Diagnosis not present

## 2018-10-07 DIAGNOSIS — M79671 Pain in right foot: Secondary | ICD-10-CM | POA: Diagnosis not present

## 2018-11-26 DIAGNOSIS — I10 Essential (primary) hypertension: Secondary | ICD-10-CM | POA: Diagnosis not present

## 2018-11-26 DIAGNOSIS — E119 Type 2 diabetes mellitus without complications: Secondary | ICD-10-CM | POA: Diagnosis not present

## 2019-01-05 DIAGNOSIS — G309 Alzheimer's disease, unspecified: Secondary | ICD-10-CM | POA: Diagnosis not present

## 2019-01-05 DIAGNOSIS — F0280 Dementia in other diseases classified elsewhere without behavioral disturbance: Secondary | ICD-10-CM | POA: Diagnosis not present

## 2019-01-05 DIAGNOSIS — E1165 Type 2 diabetes mellitus with hyperglycemia: Secondary | ICD-10-CM | POA: Diagnosis not present

## 2019-01-05 DIAGNOSIS — Z299 Encounter for prophylactic measures, unspecified: Secondary | ICD-10-CM | POA: Diagnosis not present

## 2019-01-05 DIAGNOSIS — Z6823 Body mass index (BMI) 23.0-23.9, adult: Secondary | ICD-10-CM | POA: Diagnosis not present

## 2019-01-05 DIAGNOSIS — I10 Essential (primary) hypertension: Secondary | ICD-10-CM | POA: Diagnosis not present

## 2019-01-06 DIAGNOSIS — E1151 Type 2 diabetes mellitus with diabetic peripheral angiopathy without gangrene: Secondary | ICD-10-CM | POA: Diagnosis not present

## 2019-01-06 DIAGNOSIS — B351 Tinea unguium: Secondary | ICD-10-CM | POA: Diagnosis not present

## 2019-01-06 DIAGNOSIS — M79672 Pain in left foot: Secondary | ICD-10-CM | POA: Diagnosis not present

## 2019-01-06 DIAGNOSIS — E1140 Type 2 diabetes mellitus with diabetic neuropathy, unspecified: Secondary | ICD-10-CM | POA: Diagnosis not present

## 2019-01-06 DIAGNOSIS — M79671 Pain in right foot: Secondary | ICD-10-CM | POA: Diagnosis not present

## 2019-01-26 DIAGNOSIS — I10 Essential (primary) hypertension: Secondary | ICD-10-CM | POA: Diagnosis not present

## 2019-01-26 DIAGNOSIS — E119 Type 2 diabetes mellitus without complications: Secondary | ICD-10-CM | POA: Diagnosis not present

## 2019-01-27 DIAGNOSIS — Z1331 Encounter for screening for depression: Secondary | ICD-10-CM | POA: Diagnosis not present

## 2019-01-27 DIAGNOSIS — Z299 Encounter for prophylactic measures, unspecified: Secondary | ICD-10-CM | POA: Diagnosis not present

## 2019-01-27 DIAGNOSIS — Z79899 Other long term (current) drug therapy: Secondary | ICD-10-CM | POA: Diagnosis not present

## 2019-01-27 DIAGNOSIS — Z0000 Encounter for general adult medical examination without abnormal findings: Secondary | ICD-10-CM | POA: Diagnosis not present

## 2019-01-27 DIAGNOSIS — E559 Vitamin D deficiency, unspecified: Secondary | ICD-10-CM | POA: Diagnosis not present

## 2019-01-27 DIAGNOSIS — Z1339 Encounter for screening examination for other mental health and behavioral disorders: Secondary | ICD-10-CM | POA: Diagnosis not present

## 2019-01-27 DIAGNOSIS — I10 Essential (primary) hypertension: Secondary | ICD-10-CM | POA: Diagnosis not present

## 2019-01-27 DIAGNOSIS — Z7189 Other specified counseling: Secondary | ICD-10-CM | POA: Diagnosis not present

## 2019-01-27 DIAGNOSIS — Z1211 Encounter for screening for malignant neoplasm of colon: Secondary | ICD-10-CM | POA: Diagnosis not present

## 2019-01-27 DIAGNOSIS — E7800 Pure hypercholesterolemia, unspecified: Secondary | ICD-10-CM | POA: Diagnosis not present

## 2019-01-27 DIAGNOSIS — R5383 Other fatigue: Secondary | ICD-10-CM | POA: Diagnosis not present

## 2019-01-27 DIAGNOSIS — Z6824 Body mass index (BMI) 24.0-24.9, adult: Secondary | ICD-10-CM | POA: Diagnosis not present

## 2019-01-27 DIAGNOSIS — E1165 Type 2 diabetes mellitus with hyperglycemia: Secondary | ICD-10-CM | POA: Diagnosis not present

## 2019-01-27 DIAGNOSIS — E119 Type 2 diabetes mellitus without complications: Secondary | ICD-10-CM | POA: Diagnosis not present

## 2019-02-05 DIAGNOSIS — E119 Type 2 diabetes mellitus without complications: Secondary | ICD-10-CM | POA: Diagnosis not present

## 2019-02-05 DIAGNOSIS — I10 Essential (primary) hypertension: Secondary | ICD-10-CM | POA: Diagnosis not present

## 2019-02-28 ENCOUNTER — Inpatient Hospital Stay (HOSPITAL_COMMUNITY)
Admission: EM | Admit: 2019-02-28 | Discharge: 2019-03-03 | DRG: 493 | Disposition: A | Discharge status: Skilled Nursing Facility | Payer: Medicare Other | Attending: Family Medicine | Admitting: Family Medicine

## 2019-02-28 ENCOUNTER — Other Ambulatory Visit: Payer: Self-pay

## 2019-02-28 ENCOUNTER — Emergency Department (HOSPITAL_COMMUNITY): Payer: Medicare Other

## 2019-02-28 ENCOUNTER — Encounter (HOSPITAL_COMMUNITY): Payer: Self-pay | Admitting: Emergency Medicine

## 2019-02-28 DIAGNOSIS — E785 Hyperlipidemia, unspecified: Secondary | ICD-10-CM | POA: Diagnosis present

## 2019-02-28 DIAGNOSIS — F0390 Unspecified dementia without behavioral disturbance: Secondary | ICD-10-CM | POA: Diagnosis present

## 2019-02-28 DIAGNOSIS — S8011XA Contusion of right lower leg, initial encounter: Secondary | ICD-10-CM | POA: Diagnosis present

## 2019-02-28 DIAGNOSIS — Z8249 Family history of ischemic heart disease and other diseases of the circulatory system: Secondary | ICD-10-CM

## 2019-02-28 DIAGNOSIS — I959 Hypotension, unspecified: Secondary | ICD-10-CM | POA: Diagnosis not present

## 2019-02-28 DIAGNOSIS — R52 Pain, unspecified: Secondary | ICD-10-CM | POA: Diagnosis not present

## 2019-02-28 DIAGNOSIS — I152 Hypertension secondary to endocrine disorders: Secondary | ICD-10-CM | POA: Diagnosis present

## 2019-02-28 DIAGNOSIS — M79604 Pain in right leg: Secondary | ICD-10-CM | POA: Diagnosis not present

## 2019-02-28 DIAGNOSIS — S82301S Unspecified fracture of lower end of right tibia, sequela: Secondary | ICD-10-CM | POA: Diagnosis present

## 2019-02-28 DIAGNOSIS — Z9181 History of falling: Secondary | ICD-10-CM | POA: Diagnosis not present

## 2019-02-28 DIAGNOSIS — S82301D Unspecified fracture of lower end of right tibia, subsequent encounter for closed fracture with routine healing: Secondary | ICD-10-CM | POA: Diagnosis not present

## 2019-02-28 DIAGNOSIS — S82831S Other fracture of upper and lower end of right fibula, sequela: Secondary | ICD-10-CM | POA: Diagnosis present

## 2019-02-28 DIAGNOSIS — E119 Type 2 diabetes mellitus without complications: Secondary | ICD-10-CM

## 2019-02-28 DIAGNOSIS — S82301A Unspecified fracture of lower end of right tibia, initial encounter for closed fracture: Secondary | ICD-10-CM | POA: Diagnosis not present

## 2019-02-28 DIAGNOSIS — S82431A Displaced oblique fracture of shaft of right fibula, initial encounter for closed fracture: Secondary | ICD-10-CM | POA: Diagnosis present

## 2019-02-28 DIAGNOSIS — W19XXXA Unspecified fall, initial encounter: Secondary | ICD-10-CM | POA: Diagnosis not present

## 2019-02-28 DIAGNOSIS — Z20828 Contact with and (suspected) exposure to other viral communicable diseases: Secondary | ICD-10-CM | POA: Diagnosis present

## 2019-02-28 DIAGNOSIS — W010XXA Fall on same level from slipping, tripping and stumbling without subsequent striking against object, initial encounter: Secondary | ICD-10-CM | POA: Diagnosis present

## 2019-02-28 DIAGNOSIS — E1159 Type 2 diabetes mellitus with other circulatory complications: Secondary | ICD-10-CM | POA: Diagnosis present

## 2019-02-28 DIAGNOSIS — R262 Difficulty in walking, not elsewhere classified: Secondary | ICD-10-CM | POA: Diagnosis not present

## 2019-02-28 DIAGNOSIS — S82231A Displaced oblique fracture of shaft of right tibia, initial encounter for closed fracture: Secondary | ICD-10-CM | POA: Diagnosis not present

## 2019-02-28 DIAGNOSIS — I10 Essential (primary) hypertension: Secondary | ICD-10-CM | POA: Diagnosis present

## 2019-02-28 DIAGNOSIS — Z03818 Encounter for observation for suspected exposure to other biological agents ruled out: Secondary | ICD-10-CM | POA: Diagnosis not present

## 2019-02-28 DIAGNOSIS — S82241A Displaced spiral fracture of shaft of right tibia, initial encounter for closed fracture: Secondary | ICD-10-CM

## 2019-02-28 DIAGNOSIS — Y92009 Unspecified place in unspecified non-institutional (private) residence as the place of occurrence of the external cause: Secondary | ICD-10-CM

## 2019-02-28 DIAGNOSIS — R41841 Cognitive communication deficit: Secondary | ICD-10-CM | POA: Diagnosis not present

## 2019-02-28 DIAGNOSIS — S82209A Unspecified fracture of shaft of unspecified tibia, initial encounter for closed fracture: Secondary | ICD-10-CM

## 2019-02-28 DIAGNOSIS — S82831A Other fracture of upper and lower end of right fibula, initial encounter for closed fracture: Secondary | ICD-10-CM | POA: Diagnosis not present

## 2019-02-28 DIAGNOSIS — Z4789 Encounter for other orthopedic aftercare: Secondary | ICD-10-CM | POA: Diagnosis not present

## 2019-02-28 DIAGNOSIS — D62 Acute posthemorrhagic anemia: Secondary | ICD-10-CM | POA: Diagnosis not present

## 2019-02-28 DIAGNOSIS — Z741 Need for assistance with personal care: Secondary | ICD-10-CM | POA: Diagnosis not present

## 2019-02-28 DIAGNOSIS — M6281 Muscle weakness (generalized): Secondary | ICD-10-CM | POA: Diagnosis not present

## 2019-02-28 DIAGNOSIS — Y9301 Activity, walking, marching and hiking: Secondary | ICD-10-CM | POA: Diagnosis present

## 2019-02-28 DIAGNOSIS — T148XXA Other injury of unspecified body region, initial encounter: Secondary | ICD-10-CM

## 2019-02-28 DIAGNOSIS — S82409A Unspecified fracture of shaft of unspecified fibula, initial encounter for closed fracture: Secondary | ICD-10-CM | POA: Diagnosis present

## 2019-02-28 HISTORY — DX: Essential (primary) hypertension: I10

## 2019-02-28 HISTORY — DX: Type 2 diabetes mellitus without complications: E11.9

## 2019-02-28 HISTORY — DX: Pure hypercholesterolemia, unspecified: E78.00

## 2019-02-28 HISTORY — DX: Unspecified dementia, unspecified severity, without behavioral disturbance, psychotic disturbance, mood disturbance, and anxiety: F03.90

## 2019-02-28 LAB — CBC WITH DIFFERENTIAL/PLATELET
Abs Immature Granulocytes: 0.03 10*3/uL (ref 0.00–0.07)
Basophils Absolute: 0.1 10*3/uL (ref 0.0–0.1)
Basophils Relative: 1 %
Eosinophils Absolute: 0 10*3/uL (ref 0.0–0.5)
Eosinophils Relative: 0 %
HCT: 36.7 % (ref 36.0–46.0)
Hemoglobin: 11.2 g/dL — ABNORMAL LOW (ref 12.0–15.0)
Immature Granulocytes: 0 %
Lymphocytes Relative: 6 %
Lymphs Abs: 0.7 10*3/uL (ref 0.7–4.0)
MCH: 26.2 pg (ref 26.0–34.0)
MCHC: 30.5 g/dL (ref 30.0–36.0)
MCV: 85.9 fL (ref 80.0–100.0)
Monocytes Absolute: 1 10*3/uL (ref 0.1–1.0)
Monocytes Relative: 8 %
Neutro Abs: 10.3 10*3/uL — ABNORMAL HIGH (ref 1.7–7.7)
Neutrophils Relative %: 85 %
Platelets: 297 10*3/uL (ref 150–400)
RBC: 4.27 MIL/uL (ref 3.87–5.11)
RDW: 15.9 % — ABNORMAL HIGH (ref 11.5–15.5)
WBC: 12.1 10*3/uL — ABNORMAL HIGH (ref 4.0–10.5)
nRBC: 0 % (ref 0.0–0.2)

## 2019-02-28 LAB — CBG MONITORING, ED: Glucose-Capillary: 126 mg/dL — ABNORMAL HIGH (ref 70–99)

## 2019-02-28 LAB — COMPREHENSIVE METABOLIC PANEL
ALT: 12 U/L (ref 0–44)
AST: 22 U/L (ref 15–41)
Albumin: 3.6 g/dL (ref 3.5–5.0)
Alkaline Phosphatase: 84 U/L (ref 38–126)
Anion gap: 12 (ref 5–15)
BUN: 22 mg/dL (ref 8–23)
CO2: 25 mmol/L (ref 22–32)
Calcium: 9.6 mg/dL (ref 8.9–10.3)
Chloride: 103 mmol/L (ref 98–111)
Creatinine, Ser: 0.92 mg/dL (ref 0.44–1.00)
GFR calc Af Amer: >60 mL/min (ref >60)
GFR calc non Af Amer: 57 mL/min — ABNORMAL LOW (ref >60)
Glucose, Bld: 154 mg/dL — ABNORMAL HIGH (ref 70–99)
Potassium: 4.2 mmol/L (ref 3.5–5.1)
Sodium: 140 mmol/L (ref 135–145)
Total Bilirubin: 0.5 mg/dL (ref 0.3–1.2)
Total Protein: 6.5 g/dL (ref 6.5–8.1)

## 2019-02-28 LAB — SARS CORONAVIRUS 2 BY RT PCR (HOSPITAL ORDER, PERFORMED IN ~~LOC~~ HOSPITAL LAB): SARS Coronavirus 2: NEGATIVE

## 2019-02-28 LAB — GLUCOSE, CAPILLARY: Glucose-Capillary: 150 mg/dL — ABNORMAL HIGH (ref 70–99)

## 2019-02-28 MED ORDER — MEMANTINE HCL ER 28 MG PO CP24
28.0000 mg | ORAL_CAPSULE | Freq: Every day | ORAL | Status: DC
  Administered 2019-03-01 – 2019-03-03 (×3): 28 mg via ORAL
  Filled 2019-02-28 (×4): qty 1

## 2019-02-28 MED ORDER — HYDROMORPHONE HCL 1 MG/ML IJ SOLN
0.5000 mg | INTRAMUSCULAR | Status: DC | PRN
  Administered 2019-02-28 – 2019-03-02 (×7): 0.5 mg via INTRAVENOUS
  Filled 2019-02-28 (×7): qty 0.5

## 2019-02-28 MED ORDER — ACETAMINOPHEN 650 MG RE SUPP: 650.0000 mg | Freq: Four times a day (QID) | RECTAL | Status: DC | PRN

## 2019-02-28 MED ORDER — SODIUM CHLORIDE 0.9 % IV SOLN
250.0000 mL | INTRAVENOUS | Status: DC | PRN
  Administered 2019-03-03: 250 mL via INTRAVENOUS

## 2019-02-28 MED ORDER — TRAZODONE HCL 50 MG PO TABS
50.0000 mg | ORAL_TABLET | Freq: Once | ORAL | Status: AC
  Administered 2019-03-01: 01:00:00 50 mg via ORAL
  Filled 2019-02-28: qty 1

## 2019-02-28 MED ORDER — SODIUM CHLORIDE 0.9% FLUSH: 3.0000 mL | INTRAVENOUS | Status: DC | PRN

## 2019-02-28 MED ORDER — DIPHENHYDRAMINE HCL 50 MG/ML IJ SOLN: 25.0000 mg | Freq: Four times a day (QID) | INTRAMUSCULAR | Status: DC | PRN

## 2019-02-28 MED ORDER — ENOXAPARIN SODIUM 40 MG/0.4ML ~~LOC~~ SOLN
40.0000 mg | SUBCUTANEOUS | Status: DC
  Administered 2019-02-28 – 2019-03-02 (×3): 40 mg via SUBCUTANEOUS
  Filled 2019-02-28 (×3): qty 0.4

## 2019-02-28 MED ORDER — LISINOPRIL 5 MG PO TABS
2.5000 mg | ORAL_TABLET | Freq: Every day | ORAL | Status: DC
  Administered 2019-03-01 – 2019-03-02 (×2): 2.5 mg via ORAL
  Filled 2019-02-28 (×2): qty 1

## 2019-02-28 MED ORDER — ATORVASTATIN CALCIUM 20 MG PO TABS
20.0000 mg | ORAL_TABLET | Freq: Every day | ORAL | Status: DC
  Administered 2019-02-28 – 2019-03-02 (×3): 20 mg via ORAL
  Filled 2019-02-28 (×3): qty 1

## 2019-02-28 MED ORDER — DONEPEZIL HCL 5 MG PO TABS
10.0000 mg | ORAL_TABLET | Freq: Every day | ORAL | Status: DC
  Administered 2019-02-28 – 2019-03-01 (×2): 10 mg via ORAL
  Filled 2019-02-28 (×2): qty 2

## 2019-02-28 MED ORDER — ONDANSETRON HCL 4 MG/2ML IJ SOLN: 4.0000 mg | Freq: Four times a day (QID) | INTRAMUSCULAR | Status: DC | PRN

## 2019-02-28 MED ORDER — ACETAMINOPHEN 325 MG PO TABS
650.0000 mg | ORAL_TABLET | Freq: Four times a day (QID) | ORAL | Status: DC | PRN
  Administered 2019-02-28: 650 mg via ORAL
  Filled 2019-02-28: qty 2

## 2019-02-28 MED ORDER — SODIUM CHLORIDE 0.9 % IV SOLN
INTRAVENOUS | Status: AC
  Administered 2019-02-28: 22:00:00 via INTRAVENOUS

## 2019-02-28 MED ORDER — HYDROMORPHONE HCL 1 MG/ML IJ SOLN
0.5000 mg | Freq: Once | INTRAMUSCULAR | Status: AC
  Administered 2019-02-28: 0.5 mg via INTRAVENOUS
  Filled 2019-02-28: qty 1

## 2019-02-28 MED ORDER — INSULIN ASPART 100 UNIT/ML ~~LOC~~ SOLN
0.0000 [IU] | SUBCUTANEOUS | Status: DC
  Administered 2019-02-28: 1 [IU] via SUBCUTANEOUS
  Administered 2019-03-01 (×2): 3 [IU] via SUBCUTANEOUS
  Administered 2019-03-02: 2 [IU] via SUBCUTANEOUS
  Administered 2019-03-03 (×2): 1 [IU] via SUBCUTANEOUS
  Administered 2019-03-03: 2 [IU] via SUBCUTANEOUS
  Filled 2019-02-28: qty 1

## 2019-02-28 NOTE — ED Notes (Signed)
Care giver number  Erven Colla [phone]

## 2019-02-28 NOTE — ED Provider Notes (Signed)
[facility] Provider Note   CSN: [id number] Arrival date & time: 02/28/19  1613     History   Chief Complaint Chief Complaint  Patient presents with   Leg Injury    HPI Cynthia Hanson is a 85 y.o. female.     Patient fell and injured her right lower leg.  Patient has a history of dementia and has not been able to ambulate since she fell  The history is provided by the patient and a relative. No language interpreter was used.  Fall This is a new problem. The current episode started 1 to 2 hours ago. The problem occurs constantly. The problem has been resolved. Pertinent negatives include no chest pain, no abdominal pain and no headaches. Nothing aggravates the symptoms. Nothing relieves the symptoms. She has tried nothing for the symptoms. The treatment provided no relief.    Past Medical History:  Diagnosis Date   Dementia (HCC)    Diabetes mellitus without complication (HCC)    High cholesterol    Hypertension     Patient Active Problem List   Diagnosis Date Noted   Tibial fracture 02/28/2019    Past Surgical History:  Procedure Laterality Date   CHOLECYSTECTOMY       OB History    Gravida      Para      Term      Preterm      AB      Living  0     SAB      TAB      Ectopic      Multiple      Live Births               Home Medications    Prior to Admission medications   Not on File    Family History History reviewed. No pertinent family history.  Social History Social History   Tobacco Use   Smoking status: Never Smoker   Smokeless tobacco: Never Used  Substance Use Topics   Alcohol use: Never    Frequency: Never   Drug use: Never     Allergies   Patient has no known allergies.   Review of Systems Review of Systems  Constitutional: Negative for appetite change and fatigue.  HENT: Negative for congestion, ear discharge and sinus pressure.   Eyes: Negative for discharge.    Respiratory: Negative for cough.   Cardiovascular: Negative for chest pain.  Gastrointestinal: Negative for abdominal pain and diarrhea.  Genitourinary: Negative for frequency and hematuria.  Musculoskeletal: Negative for back pain.       Right lower leg pain  Skin: Negative for rash.  Neurological: Negative for seizures and headaches.  Psychiatric/Behavioral: Negative for hallucinations.     Physical Exam Updated Vital Signs BP (!) 135/55    Pulse 81    Temp 98.4 F (36.9 C) (Oral)    Resp 16    Ht 5\' 1"  (1.549 m)    Wt 54.4 kg    SpO2 94%    BMI 22.67 kg/m   Physical Exam Vitals signs and nursing note reviewed.  Constitutional:      Appearance: She is well-developed.  HENT:     Head: Normocephalic.     Mouth/Throat:     Mouth: Mucous membranes are moist.  Eyes:     General: No scleral icterus.    Conjunctiva/sclera: Conjunctivae normal.  Neck:     Musculoskeletal: Neck supple.     Thyroid: No  thyromegaly.  Cardiovascular:     Rate and Rhythm: Normal rate and regular rhythm.     Heart sounds: No murmur. No friction rub. No gallop.   Pulmonary:     Breath sounds: No stridor. No wheezing or rales.  Chest:     Chest wall: No tenderness.  Abdominal:     General: There is no distension.     Tenderness: There is no abdominal tenderness. There is no rebound.  Musculoskeletal:     Comments: Deformity and tenderness to distal right lower leg  Lymphadenopathy:     Cervical: No cervical adenopathy.  Skin:    Findings: No erythema or rash.  Neurological:     Mental Status: She is oriented to person, place, and time.     Motor: No abnormal muscle tone.     Coordination: Coordination normal.  Psychiatric:        Behavior: Behavior normal.      ED Treatments / Results  Labs (all labs ordered are listed, but only abnormal results are displayed) Labs Reviewed  SARS CORONAVIRUS 2 (HOSPITAL ORDER, [place] [facility])  CBC WITH DIFFERENTIAL/PLATELET   COMPREHENSIVE METABOLIC PANEL  COMPREHENSIVE METABOLIC PANEL  CBC    EKG None  Radiology Dg Tibia/fibula Right  Result Date: 02/28/2019 CLINICAL DATA:  PT brought in by [facility] from home with family today. EMS reports pt was viewed turning around while walking and her right lower leg felt a pop and she fell to the ground. Obvious fracture noted to right lower leg weak EXAM: RIGHT TIBIA AND FIBULA - 2 VIEW COMPARISON:  None. FINDINGS: Oblique fractures through the distal tibial diaphysis with 1 bone with posterior displacement of the distal fracture fragment seen on cross-table projection. Mild override. Oblique fracture through the distal fibula just above the metaphysis. Additional fracture of the proximal fibula at the upper third of the diaphysis with several mm of displacement ( 4 mm). IMPRESSION: 1. Oblique fracture through the distal tibial diaphysis with 1 bone width displacement and mild override. 2. Twp oblique fractures through the diaphysis of the fibula. Electronically Signed   By: Suzy Bouchard M.D.   On: 02/28/2019 17:44   Dg Pelvis Portable  Result Date: 02/28/2019 CLINICAL DATA:  PT brought in by [facility] from home with family today. EMS reports pt was viewed turning around while walking and her right lower leg felt a pop and she fell to the ground. Obvious fracture noted to right lower leg weak EXAM: PORTABLE PELVIS 1-2 VIEWS COMPARISON:  None. FINDINGS: Hips are located. No pelvic fracture. No femoral neck fracture on AP view IMPRESSION: No pelvic fracture or hip fracture.  No dislocation Electronically Signed   By: Suzy Bouchard M.D.   On: 02/28/2019 17:40   Dg Chest Port 1 View  Result Date: 02/28/2019 CLINICAL DATA:  PT brought in by [facility] from home with family today. EMS reports pt was viewed turning around while walking and her right lower leg felt a pop and she fell to the ground. Obvious fracture noted to right lower legdata EXAM: PORTABLE CHEST 1 VIEW COMPARISON:   Radiograph 07/19/2018 FINDINGS: Normal mediastinum and cardiac silhouette. Normal pulmonary vasculature. No evidence of effusion, infiltrate, or pneumothorax. No acute bony abnormality. IMPRESSION: No acute cardiopulmonary process. Electronically Signed   By: Suzy Bouchard M.D.   On: 02/28/2019 17:39    Procedures Procedures (including critical care time)  Medications Ordered in ED Medications  enoxaparin (LOVENOX) injection 40 mg (has no administration  in time range)  sodium chloride flush (NS) 0.9 % injection 3 mL (has no administration in time range)  0.9 %  sodium chloride infusion (has no administration in time range)  acetaminophen (TYLENOL) tablet 650 mg (has no administration in time range)    Or  acetaminophen (TYLENOL) suppository 650 mg (has no administration in time range)  insulin aspart (novoLOG) injection 0-9 Units (has no administration in time range)  HYDROmorphone (DILAUDID) injection 0.5 mg (0.5 mg Intravenous Given 02/28/19 1853)     Initial Impression / Assessment and Plan / ED Course  I have reviewed the triage vital signs and the nursing notes.  Pertinent labs & imaging results that were available during my care of the patient were reviewed by me and considered in my medical decision making (see chart for details).       Patient with a fractured tib-fib on the right.  Is closed.  I spoke with Dr. Romeo Apple the orthopedic surgeon and he will see the patient tomorrow but she will be admitted to medicine and orthopedics will decide on surgery tomorrow  Final Clinical Impressions(s) / ED Diagnoses   Final diagnoses:  Fall, initial encounter    ED Discharge Orders    None       Bethann Berkshire, MD 02/28/19 1940

## 2019-02-28 NOTE — Consult Note (Signed)
[facility]   Patient ID: Cynthia Hanson, female   DOB: [birth year], 85 y.o.   MRN: [id number]  New patient  Requested by: Dr Karilyn Cota  Reason for: right tibial fracture   Based on the information below I recommend IM Nail tibia   Chief Complaint  Patient presents with  . Leg Injury/ left leg pain      HPI  The information is from the medical record secondary to dementia. She fell was brought in with a fracture of the left tibia  Location-left tibia  Duration-DOI=9/26 Severity cna Quality cna Modified by cna  Review of Systems (all) Review of Systems  Unable to perform ROS: Dementia    Past Medical History:  Diagnosis Date  . Dementia (HCC)   . Diabetes mellitus without complication (HCC)   . High cholesterol   . Hypertension     Past Surgical History:  Procedure Laterality Date  . CHOLECYSTECTOMY      Family History  Problem Relation Age of Onset  . CAD Father    Social History   Tobacco Use  . Smoking status: Never Smoker  . Smokeless tobacco: Never Used  Substance Use Topics  . Alcohol use: Never    Frequency: Never  . Drug use: Never   No Known Allergies  Current Facility-Administered Medications:  .  0.9 %  sodium chloride infusion, 250 mL, Intravenous, PRN, Pearson Grippe, MD .  acetaminophen (TYLENOL) tablet 650 mg, 650 mg, Oral, Q6H PRN, 650 mg at 02/28/19 2206 **OR** acetaminophen (TYLENOL) suppository 650 mg, 650 mg, Rectal, Q6H PRN, Pearson Grippe, MD .  atorvastatin (LIPITOR) tablet 20 mg, 20 mg, Oral, q1800, Pearson Grippe, MD, 20 mg at 03/01/19 1723 .  diphenhydrAMINE (BENADRYL) injection 25 mg, 25 mg, Intravenous, Q6H PRN, Pearson Grippe, MD .  donepezil (ARICEPT) tablet 10 mg, 10 mg, Oral, QHS, Pearson Grippe, MD, 10 mg at 02/28/19 2206 .  enoxaparin (LOVENOX) injection 40 mg, 40 mg, Subcutaneous, Q24H, Pearson Grippe, MD, 40 mg at 02/28/19 2207 .  HYDROmorphone (DILAUDID) injection 0.5 mg, 0.5 mg, Intravenous, Q4H PRN, Pearson Grippe,  MD, 0.5 mg at 03/01/19 1018 .  [START ON 03/02/2019] influenza vaccine adjuvanted (FLUAD) injection 0.5 mL, 0.5 mL, Intramuscular, Tomorrow-1000, Pearson Grippe, MD .  insulin aspart (novoLOG) injection 0-9 Units, 0-9 Units, Subcutaneous, Q4H, Pearson Grippe, MD, 3 Units at 03/01/19 0030 .  lisinopril (ZESTRIL) tablet 2.5 mg, 2.5 mg, Oral, Daily, Pearson Grippe, MD, 2.5 mg at 03/01/19 1722 .  memantine (NAMENDA XR) 24 hr capsule 28 mg, 28 mg, Oral, Daily, Pearson Grippe, MD, 28 mg at 03/01/19 1723 .  ondansetron (ZOFRAN) injection 4 mg, 4 mg, Intravenous, Q6H PRN, Pearson Grippe, MD .  sodium chloride flush (NS) 0.9 % injection 3 mL, 3 mL, Intravenous, Q12H, Pearson Grippe, MD, 3 mL at 03/01/19 1020 .  sodium chloride flush (NS) 0.9 % injection 3 mL, 3 mL, Intravenous, PRN, Pearson Grippe, MD    Physical Exam(=30) BP (!) 139/57 (BP Location: Left Arm)   Pulse 75   Temp 97.9 F (36.6 C) (Oral)   Resp 18   Ht 5\' 1"  (1.549 m)   Wt 58 kg   SpO2 91%   BMI 24.16 kg/m   Gen. Appearance normal  Peripheral vascular system normal temp. No edema  Lymph nodes groin neg  Gait unable to walk   Left Upper extremity  Inspection revealed no malalignment or asymmetry  Assessment of range of motion: Full range of motion was  recorded  Assessment of stability: Elbow wrist and hand and shoulder were stable  Assessment of muscle strength and tone revealed grade 5 muscle strength and normal muscle tone  Skin was normal without rash lesion or ulceration  Right upper extremity  Inspection revealed no malalignment or asymmetry  Assessment of range of motion: Full range of motion was recorded  Assessment of stability: Elbow wrist and hand and shoulder were stable  Assessment of muscle strength and tone revealed grade 5 muscle strength and normal muscle tone  Skin was normal without rash lesion or ulceration  Right Lower extremity Right leg found in ems splint barely on  Angular deformity swelling and bruising of the tibia     Assessment of range of motion: moving the ankle joint and the toes   Assessment of stability: Ankle, knee and hip were stable  Assessment of muscle strength and tone revealed  normal muscle tone  Skin was normal without rash lesion or ulceration  Left lower extremity Inspection revealed no malalignment or asymmetry  Assessment of range of motion: Full range of motion was recorded  Assessment of stability: Ankle, knee and hip were stable  Assessment of muscle strength and tone revealed grade 5 muscle strength and normal muscle tone  Skin was normal without rash lesion or ulceration  Coordination was tested by finger-to-nose nose and was normal Deep tendon reflexes were 2+ in the upper extremities Examination of sensation by touch was normal  Mental status  dis-Oriented to time  place oriented to person normal  Mood and affect normal without depression anxiety or agitation  Dx:   Data Reviewed  ER RECORD REVIEWED: CONFIRMS HISTORY   I reviewed the following images and the reports and my independent interpretation is spiral tib fib closed fracture with displacement 100% and fracture of the fibula proximal and distal    Assessment  Right tibial shaft fracture, right fibula fracture proximal and distal   Plan   Right tibial shaft fracture fixation with IM nail    Carole Civil MD

## 2019-02-28 NOTE — ED Triage Notes (Addendum)
PT brought in by [facility] from home with family today. EMS reports pt was viewed turning around while walking and her right lower leg felt a pop and she fell to the ground. Obvious fracture noted to right lower leg on arrival with pulses present to right foot. PT was given 169mcg of fentanyl in route to ED.

## 2019-02-28 NOTE — ED Notes (Signed)
ED Provider at bedside. 

## 2019-02-28 NOTE — ED Notes (Signed)
Report given to inpatient nurse. Nurse requesting pt hold until rapid covid test is resulted.

## 2019-02-28 NOTE — H&P (Addendum)
TRH H&P    Patient Demographics:    Cynthia Hanson, is a 85 y.o. female  MRN: [id number]  DOB - 13-Feb-1933  Admit Date - 02/28/2019  Referring MD/NP/PA: Dorthula Perfect  Outpatient Primary MD for the patient is Medicine, [facility]  Patient coming from:  home  Chief complaint-  Fall,    HPI:    Cynthia Hanson  is a 85 y.o. female, w hypertension, hyperlipidemia, dementia, apparently had mechanical fall at home.  Pt tripped over her feet, and fell.  Pt has right lower ext pain. Pt denies syncope, cp, palp, sob, n/v, abd pain, diarrhea, brbpr.   In Ed,  T 98.4, P 77 R 21, Bp 161/63  Pox 100% on RA  Wt 54.4kg  Xray R tib/ fib IMPRESSION: 1. Oblique fracture through the distal tibial diaphysis with 1 bone width displacement and mild override. 2. Twp oblique fractures through the diaphysis of the fibula.  Xray hip IMPRESSION: No pelvic fracture or hip fracture.  No dislocation  covid negative  Wbc 12.1, hgb 11.2, Plt 297 Na 140, K 4.2, Bun 22, Creatinine 0.92 Ast 22, Alt 12  Pt will be admitted for fall and R tib/ fib fracture    Review of systems:    In addition to the HPI above,  No Fever-chills, No Headache, No changes with Vision or hearing, No problems swallowing food or Liquids, No Chest pain, Cough or Shortness of Breath, No Abdominal pain, No Nausea or Vomiting, bowel movements are regular, No Blood in stool or Urine, No dysuria, No new skin rashes or bruises,   No new weakness, tingling, numbness in any extremity, No recent weight gain or loss, No polyuria, polydypsia or polyphagia, No significant Mental Stressors.  All other systems reviewed and are negative.    Past History of the following :    Past Medical History:  Diagnosis Date  . Dementia (HCC)   . Diabetes mellitus without complication (HCC)   . High cholesterol   . Hypertension       Past Surgical  History:  Procedure Laterality Date  . CHOLECYSTECTOMY        Social History:      Social History   Tobacco Use  . Smoking status: Never Smoker  . Smokeless tobacco: Never Used  Substance Use Topics  . Alcohol use: Never    Frequency: Never       Family History :     Family History  Problem Relation Age of Onset  . CAD Father        Home Medications:   Prior to Admission medications   Not on File     Allergies:    No Known Allergies   Physical Exam:   Vitals  Blood pressure (!) 156/65, pulse 76, temperature 98 F (36.7 C), temperature source Oral, resp. rate 17, height  (1.549 m), weight 54.4 kg, SpO2 100 %.  1.  General: axoxo1 (person, not place, or time)  2. Psychiatric: euthymic  3. Neurologic: cn2-12 intact, reflexes 2+ symmetric, direct, consensual,  near intact  4. HEENMT:  Anicteric, pupils 1.74mm symmetric, direct, consensual intact Neck: no jvd  5. Respiratory : CTAB  6. Cardiovascular : rrr s1, s2, no m/g/r  7. Gastrointestinal:  Abd: soft, nt, nd, +bs  8. Skin:  Ext: no c/c/e,  No rash  9.Musculoskeletal:  Good ROM      Data Review:    CBC Recent Labs  Lab 02/28/19 2003  WBC 12.1*  HGB 11.2*  HCT 36.7  PLT 297  MCV 85.9  MCH 26.2  MCHC 30.5  RDW 15.9*  LYMPHSABS 0.7  MONOABS 1.0  EOSABS 0.0  BASOSABS 0.1   ------------------------------------------------------------------------------------------------------------------  Results for orders placed or performed during the hospital encounter of 02/28/19 (from the past 48 hour(s))  SARS Coronavirus 2 [facility] order, Performed in [facility] hospital lab) Nasopharyngeal Nasopharyngeal Swab     Status: None   Collection Time: 02/28/19  7:16 PM   Specimen: Nasopharyngeal Swab  Result Value Ref Range   SARS Coronavirus 2 NEGATIVE NEGATIVE    Comment: (NOTE) If result is NEGATIVE SARS-CoV-2 target nucleic acids are NOT DETECTED. The SARS-CoV-2 RNA is  generally detectable in upper and lower  respiratory specimens during the acute phase of infection. The lowest  concentration of SARS-CoV-2 viral copies this assay can detect is 250  copies / mL. A negative result does not preclude SARS-CoV-2 infection  and should not be used as the sole basis for treatment or other  patient management decisions.  A negative result may occur with  improper specimen collection / handling, submission of specimen other  than nasopharyngeal swab, presence of viral mutation(s) within the  areas targeted by this assay, and inadequate number of viral copies  (<250 copies / mL). A negative result must be combined with clinical  observations, patient history, and epidemiological information. If result is POSITIVE SARS-CoV-2 target nucleic acids are DETECTED. The SARS-CoV-2 RNA is generally detectable in upper and lower  respiratory specimens dur ing the acute phase of infection.  Positive  results are indicative of active infection with SARS-CoV-2.  Clinical  correlation with patient history and other diagnostic information is  necessary to determine patient infection status.  Positive results do  not rule out bacterial infection or co-infection with other viruses. If result is PRESUMPTIVE POSTIVE SARS-CoV-2 nucleic acids MAY BE PRESENT.   A presumptive positive result was obtained on the submitted specimen  and confirmed on repeat testing.  While 2019 novel coronavirus  (SARS-CoV-2) nucleic acids may be present in the submitted sample  additional confirmatory testing may be necessary for epidemiological  and / or clinical management purposes  to differentiate between  SARS-CoV-2 and other Sarbecovirus currently known to infect humans.  If clinically indicated additional testing with an alternate test  methodology [phone]) is advised. The SARS-CoV-2 RNA is generally  detectable in upper and lower respiratory sp ecimens during the acute  phase of infection.  The expected result is Negative. Fact Sheet for Patients:  BoilerBrush.com.cy Fact Sheet for Healthcare Providers: https://pope.com/ This test is not yet approved or cleared by the [place] FDA and has been authorized for detection and/or diagnosis of SARS-CoV-2 by FDA under an Emergency Use Authorization (EUA).  This EUA will remain in effect (meaning this test can be used) for the duration of the COVID-19 declaration under Section 564(b)(1) of the Act, 21 U.S.C. section 360bbb-3(b)(1), unless the authorization is terminated or revoked sooner. Performed at [facility], [address], [place], [place] [address]   CBG  monitoring, ED     Status: Abnormal   Collection Time: 02/28/19  7:49 PM  Result Value Ref Range   Glucose-Capillary 126 (H) 70 - 99 mg/dL  CBC with Differential/Platelet     Status: Abnormal   Collection Time: 02/28/19  8:03 PM  Result Value Ref Range   WBC 12.1 (H) 4.0 - 10.5 K/uL   RBC 4.27 3.87 - 5.11 MIL/uL   Hemoglobin 11.2 (L) 12.0 - 15.0 g/dL   HCT 32.3 55.7 - 32.2 %   MCV 85.9 80.0 - 100.0 fL   MCH 26.2 26.0 - 34.0 pg   MCHC 30.5 30.0 - 36.0 g/dL   RDW 02.5 (H) 42.7 - 06.2 %   Platelets 297 150 - 400 K/uL   nRBC 0.0 0.0 - 0.2 %   Neutrophils Relative % 85 %   Neutro Abs 10.3 (H) 1.7 - 7.7 K/uL   Lymphocytes Relative 6 %   Lymphs Abs 0.7 0.7 - 4.0 K/uL   Monocytes Relative 8 %   Monocytes Absolute 1.0 0.1 - 1.0 K/uL   Eosinophils Relative 0 %   Eosinophils Absolute 0.0 0.0 - 0.5 K/uL   Basophils Relative 1 %   Basophils Absolute 0.1 0.0 - 0.1 K/uL   Immature Granulocytes 0 %   Abs Immature Granulocytes 0.03 0.00 - 0.07 K/uL    Comment: Performed at [facility], [address], [place], [place] [address]  Comprehensive metabolic panel     Status: Abnormal   Collection Time: 02/28/19  8:03 PM  Result Value Ref Range   Sodium 140 135 - 145 mmol/L   Potassium 4.2 3.5 - 5.1 mmol/L   Chloride 103 98 -  111 mmol/L   CO2 25 22 - 32 mmol/L   Glucose, Bld 154 (H) 70 - 99 mg/dL   BUN 22 8 - 23 mg/dL   Creatinine, Ser 3.15 0.44 - 1.00 mg/dL   Calcium 9.6 8.9 - 17.6 mg/dL   Total Protein 6.5 6.5 - 8.1 g/dL   Albumin 3.6 3.5 - 5.0 g/dL   AST 22 15 - 41 U/L   ALT 12 0 - 44 U/L   Alkaline Phosphatase 84 38 - 126 U/L   Total Bilirubin 0.5 0.3 - 1.2 mg/dL   GFR calc non Af Amer 57 (L) >60 mL/min   GFR calc Af Amer >60 >60 mL/min   Anion gap 12 5 - 15    Comment: Performed at [facility], [address], [place], [place] [address]  Glucose, capillary     Status: Abnormal   Collection Time: 02/28/19 10:04 PM  Result Value Ref Range   Glucose-Capillary 150 (H) 70 - 99 mg/dL    Chemistries  Recent Labs  Lab 02/28/19 2003  NA 140  K 4.2  CL 103  CO2 25  GLUCOSE 154*  BUN 22  CREATININE 0.92  CALCIUM 9.6  AST 22  ALT 12  ALKPHOS 84  BILITOT 0.5   ------------------------------------------------------------------------------------------------------------------  ------------------------------------------------------------------------------------------------------------------ GFR: Estimated Creatinine Clearance: 34.3 mL/min (by C-G formula based on SCr of 0.92 mg/dL). Liver Function Tests: Recent Labs  Lab 02/28/19 2003  AST 22  ALT 12  ALKPHOS 84  BILITOT 0.5  PROT 6.5  ALBUMIN 3.6   No results for input(s): LIPASE, AMYLASE in the last 168 hours. No results for input(s): AMMONIA in the last 168 hours. Coagulation Profile: No results for input(s): INR, PROTIME in the last 168 hours. Cardiac Enzymes: No results for input(s): CKTOTAL, CKMB, CKMBINDEX, TROPONINI in  the last 168 hours. BNP (last 3 results) No results for input(s): PROBNP in the last 8760 hours. HbA1C: No results for input(s): HGBA1C in the last 72 hours. CBG: Recent Labs  Lab 02/28/19 1949 02/28/19 2204  GLUCAP 126* 150*   Lipid Profile: No results for input(s): CHOL, HDL, LDLCALC, TRIG, CHOLHDL,  LDLDIRECT in the last 72 hours. Thyroid Function Tests: No results for input(s): TSH, T4TOTAL, FREET4, T3FREE, THYROIDAB in the last 72 hours. Anemia Panel: No results for input(s): VITAMINB12, FOLATE, FERRITIN, TIBC, IRON, RETICCTPCT in the last 72 hours.  --------------------------------------------------------------------------------------------------------------- Urine analysis: No results found for: COLORURINE, APPEARANCEUR, LABSPEC, PHURINE, GLUCOSEU, HGBUR, BILIRUBINUR, KETONESUR, PROTEINUR, UROBILINOGEN, NITRITE, LEUKOCYTESUR    Imaging Results:    Dg Tibia/fibula Right  Result Date: 02/28/2019 CLINICAL DATA:  PT brought in by [facility] from home with family today. EMS reports pt was viewed turning around while walking and her right lower leg felt a pop and she fell to the ground. Obvious fracture noted to right lower leg weak EXAM: RIGHT TIBIA AND FIBULA - 2 VIEW COMPARISON:  None. FINDINGS: Oblique fractures through the distal tibial diaphysis with 1 bone with posterior displacement of the distal fracture fragment seen on cross-table projection. Mild override. Oblique fracture through the distal fibula just above the metaphysis. Additional fracture of the proximal fibula at the upper third of the diaphysis with several mm of displacement ( 4 mm). IMPRESSION: 1. Oblique fracture through the distal tibial diaphysis with 1 bone width displacement and mild override. 2. Twp oblique fractures through the diaphysis of the fibula. Electronically Signed   By: Suzy Bouchard M.D.   On: 02/28/2019 17:44   Dg Pelvis Portable  Result Date: 02/28/2019 CLINICAL DATA:  PT brought in by [facility] from home with family today. EMS reports pt was viewed turning around while walking and her right lower leg felt a pop and she fell to the ground. Obvious fracture noted to right lower leg weak EXAM: PORTABLE PELVIS 1-2 VIEWS COMPARISON:  None. FINDINGS: Hips are located. No pelvic fracture. No femoral neck fracture  on AP view IMPRESSION: No pelvic fracture or hip fracture.  No dislocation Electronically Signed   By: Suzy Bouchard M.D.   On: 02/28/2019 17:40   Dg Chest Port 1 View  Result Date: 02/28/2019 CLINICAL DATA:  PT brought in by [facility] from home with family today. EMS reports pt was viewed turning around while walking and her right lower leg felt a pop and she fell to the ground. Obvious fracture noted to right lower legdata EXAM: PORTABLE CHEST 1 VIEW COMPARISON:  Radiograph 07/19/2018 FINDINGS: Normal mediastinum and cardiac silhouette. Normal pulmonary vasculature. No evidence of effusion, infiltrate, or pneumothorax. No acute bony abnormality. IMPRESSION: No acute cardiopulmonary process. Electronically Signed   By: Suzy Bouchard M.D.   On: 02/28/2019 17:39        Assessment & Plan:    Principal Problem:   Tibial fracture Active Problems:   Dementia ([place])   Essential hypertension   Hyperlipidemia   Diabetes ([place]) R tib / fib fracture 12 lead ekg urinalysis Pt/ inr, ptt NPO after mn Dilaudid 0.5mg  iv q4h prn Zofran 4mg  iv q6h rpn  Benadryl 25mg  iv q6h prn Orthopedics consulted by ED Pt / ot to evaluate and tx  Dementia Cont Aricept 10mg  po qday Cont Namenda XR 28mg  po qday  Hypertension Cont Lisinopril 2.5mg  po qday  Hyperlipidemia Cont Lipitor 20mg  po qhs  Dm2 Stop metformin Stop actos Stop lantus fsbs q4h,  ISS  DVT Prophylaxis-   Lovenox - SCDs   AM Labs Ordered, also please review Full Orders  Family Communication: Admission, patients condition and plan of care including tests being ordered have been discussed with the patient  who indicate understanding and agree with the plan and Code Status.  Code Status:  FULL CODE per patient and her family  Admission status: Inpatient: Based on patients clinical presentation and evaluation of above clinical data, I have made determination that patient meets Inpatient criteria at this time.  Pt has R tib/fib fracture,   Pt will require orthopedic intervention.  High risk of clinical deterioration.  Pt will require >2 nites stay  Time spent in minutes : 55 minutes   Pearson GrippeJames Bolivar Koranda M.D on 02/28/2019 at 10:15 PM

## 2019-03-01 LAB — CBC
HCT: 33.5 % — ABNORMAL LOW (ref 36.0–46.0)
Hemoglobin: 10.1 g/dL — ABNORMAL LOW (ref 12.0–15.0)
MCH: 25.9 pg — ABNORMAL LOW (ref 26.0–34.0)
MCHC: 30.1 g/dL (ref 30.0–36.0)
MCV: 85.9 fL (ref 80.0–100.0)
Platelets: 254 10*3/uL (ref 150–400)
RBC: 3.9 MIL/uL (ref 3.87–5.11)
RDW: 16 % — ABNORMAL HIGH (ref 11.5–15.5)
WBC: 8.1 10*3/uL (ref 4.0–10.5)
nRBC: 0 % (ref 0.0–0.2)

## 2019-03-01 LAB — APTT: aPTT: 32 seconds (ref 24–36)

## 2019-03-01 LAB — COMPREHENSIVE METABOLIC PANEL
ALT: 12 U/L (ref 0–44)
AST: 18 U/L (ref 15–41)
Albumin: 3.2 g/dL — ABNORMAL LOW (ref 3.5–5.0)
Alkaline Phosphatase: 79 U/L (ref 38–126)
Anion gap: 8 (ref 5–15)
BUN: 22 mg/dL (ref 8–23)
CO2: 25 mmol/L (ref 22–32)
Calcium: 9.1 mg/dL (ref 8.9–10.3)
Chloride: 107 mmol/L (ref 98–111)
Creatinine, Ser: 0.79 mg/dL (ref 0.44–1.00)
GFR calc Af Amer: >60 mL/min (ref >60)
GFR calc non Af Amer: >60 mL/min (ref >60)
Glucose, Bld: 109 mg/dL — ABNORMAL HIGH (ref 70–99)
Potassium: 3.8 mmol/L (ref 3.5–5.1)
Sodium: 140 mmol/L (ref 135–145)
Total Bilirubin: 0.7 mg/dL (ref 0.3–1.2)
Total Protein: 5.8 g/dL — ABNORMAL LOW (ref 6.5–8.1)

## 2019-03-01 LAB — TYPE AND SCREEN
ABO/RH(D): A POS
Antibody Screen: NEGATIVE

## 2019-03-01 LAB — GLUCOSE, CAPILLARY
Glucose-Capillary: 118 mg/dL — ABNORMAL HIGH (ref 70–99)
Glucose-Capillary: 120 mg/dL — ABNORMAL HIGH (ref 70–99)
Glucose-Capillary: 219 mg/dL — ABNORMAL HIGH (ref 70–99)
Glucose-Capillary: 238 mg/dL — ABNORMAL HIGH (ref 70–99)
Glucose-Capillary: 88 mg/dL (ref 70–99)
Glucose-Capillary: 91 mg/dL (ref 70–99)

## 2019-03-01 LAB — PROTIME-INR
INR: 1.1 (ref 0.8–1.2)
Prothrombin Time: 14 seconds (ref 11.4–15.2)

## 2019-03-01 LAB — SURGICAL PCR SCREEN
MRSA, PCR: NEGATIVE
Staphylococcus aureus: NEGATIVE

## 2019-03-01 MED ORDER — SODIUM CHLORIDE 0.9% FLUSH
3.0000 mL | Freq: Two times a day (BID) | INTRAVENOUS | Status: DC
  Administered 2019-03-01 – 2019-03-03 (×2): 3 mL via INTRAVENOUS

## 2019-03-01 MED ORDER — CHLORHEXIDINE GLUCONATE 4 % EX LIQD
60.0000 mL | Freq: Once | CUTANEOUS | Status: AC
  Administered 2019-03-01: 4 via TOPICAL

## 2019-03-01 MED ORDER — INFLUENZA VAC A&B SA ADJ QUAD 0.5 ML IM PRSY: 0.5000 mL | PREFILLED_SYRINGE | INTRAMUSCULAR | Status: DC

## 2019-03-01 NOTE — Progress Notes (Signed)
Subjective: This lady was admitted yesterday having had a fall.  She had right lower leg pain and had sustained fracture of the distal tibia with displacement.  Also there appeared to be 2 oblique fractures of the fibula.  She apparently has been seen by orthopedic surgeon and is due to have surgery today.  She has no complaints, she has underlying dementia.   Objective: Vital signs in last 24 hours: Temp:  [97.9 F (36.6 C)-98.4 F (36.9 C)] 97.9 F (36.6 C) (09/27 0415) Pulse Rate:  [69-81] 69 (09/27 0415) Resp:  [14-21] 16 (09/27 0415) BP: (123-161)/(50-73) 143/60 (09/27 0415) SpO2:  [94 %-100 %] 99 % (09/27 0415) Weight:  [54.4 kg-58 kg] 58 kg (09/27 0700)  Intake/Output from previous day: 09/26 0701 - 09/27 0700 In: 82.5 [I.V.:82.5] Out: 300 [Urine:300] Intake/Output this shift: No intake/output data recorded.  Recent Labs    02/28/19 2003 03/01/19 0619  HGB 11.2* 10.1*   Recent Labs    02/28/19 2003 03/01/19 0619  WBC 12.1* 8.1  RBC 4.27 3.90  HCT 36.7 33.5*  PLT 297 254   Recent Labs    02/28/19 2003 03/01/19 0619  NA 140 140  K 4.2 3.8  CL 103 107  CO2 25 25  BUN 22 22  CREATININE 0.92 0.79  GLUCOSE 154* 109*  CALCIUM 9.6 9.1   Recent Labs    03/01/19 0619  INR 1.1    She appears to be hemodynamically stable.  She is alert but not orientated.  Her speech is somewhat mumbling which I suspect is her baseline.  Assessment/Plan: 1.  Right tibia/fibula fracture.  Analgesia.  Orthopedic evaluation and treatment. 2.  Dementia.  Stable.  Continue with home medications. 3.  Hypertension.  Continue with home medications. 3.  Diabetes.  Continue with sliding scale of insulin.   Britta Louth C Yancarlos Berthold 03/01/2019, 10:41 AM

## 2019-03-01 NOTE — Progress Notes (Signed)
Held AM meds because patient was NPO and was scheduled for surgery today.

## 2019-03-01 NOTE — Plan of Care (Signed)
  Problem: Education: Goal: Knowledge of General Education information will improve Description Including pain rating scale, medication(s)/side effects and non-pharmacologic comfort measures Outcome: Progressing   Problem: Health Behavior/Discharge Planning: Goal: Ability to manage health-related needs will improve Outcome: Progressing   

## 2019-03-02 ENCOUNTER — Inpatient Hospital Stay: Payer: Medicare Other

## 2019-03-02 ENCOUNTER — Encounter (HOSPITAL_COMMUNITY)
Admission: EM | Disposition: A | Discharge status: Skilled Nursing Facility | Payer: Self-pay | Source: Home / Self Care | Attending: Family Medicine

## 2019-03-02 ENCOUNTER — Inpatient Hospital Stay (HOSPITAL_COMMUNITY): Payer: Medicare Other

## 2019-03-02 ENCOUNTER — Inpatient Hospital Stay (HOSPITAL_COMMUNITY): Payer: Medicare Other | Admitting: Anesthesiology

## 2019-03-02 DIAGNOSIS — S82301A Unspecified fracture of lower end of right tibia, initial encounter for closed fracture: Secondary | ICD-10-CM

## 2019-03-02 HISTORY — PX: TIBIA IM NAIL INSERTION: SHX2516

## 2019-03-02 LAB — URINALYSIS, ROUTINE W REFLEX MICROSCOPIC
Bacteria, UA: NONE SEEN
Bilirubin Urine: NEGATIVE
Glucose, UA: 150 mg/dL — AB
Ketones, ur: 80 mg/dL — AB
Leukocytes,Ua: NEGATIVE
Nitrite: NEGATIVE
Protein, ur: NEGATIVE mg/dL
Specific Gravity, Urine: 1.021 (ref 1.005–1.030)
pH: 5 (ref 5.0–8.0)

## 2019-03-02 LAB — GLUCOSE, CAPILLARY
Glucose-Capillary: 114 mg/dL — ABNORMAL HIGH (ref 70–99)
Glucose-Capillary: 129 mg/dL — ABNORMAL HIGH (ref 70–99)
Glucose-Capillary: 145 mg/dL — ABNORMAL HIGH (ref 70–99)
Glucose-Capillary: 154 mg/dL — ABNORMAL HIGH (ref 70–99)
Glucose-Capillary: 183 mg/dL — ABNORMAL HIGH (ref 70–99)
Glucose-Capillary: 77 mg/dL (ref 70–99)
Glucose-Capillary: 83 mg/dL (ref 70–99)
Glucose-Capillary: 95 mg/dL (ref 70–99)

## 2019-03-02 LAB — CBC
HCT: 33.3 % — ABNORMAL LOW (ref 36.0–46.0)
Hemoglobin: 10 g/dL — ABNORMAL LOW (ref 12.0–15.0)
MCH: 25.8 pg — ABNORMAL LOW (ref 26.0–34.0)
MCHC: 30 g/dL (ref 30.0–36.0)
MCV: 85.8 fL (ref 80.0–100.0)
Platelets: 240 10*3/uL (ref 150–400)
RBC: 3.88 MIL/uL (ref 3.87–5.11)
RDW: 15.9 % — ABNORMAL HIGH (ref 11.5–15.5)
WBC: 7.7 10*3/uL (ref 4.0–10.5)
nRBC: 0 % (ref 0.0–0.2)

## 2019-03-02 LAB — BASIC METABOLIC PANEL
Anion gap: 9 (ref 5–15)
BUN: 18 mg/dL (ref 8–23)
CO2: 27 mmol/L (ref 22–32)
Calcium: 9.1 mg/dL (ref 8.9–10.3)
Chloride: 105 mmol/L (ref 98–111)
Creatinine, Ser: 0.73 mg/dL (ref 0.44–1.00)
GFR calc Af Amer: >60 mL/min (ref >60)
GFR calc non Af Amer: >60 mL/min (ref >60)
Glucose, Bld: 103 mg/dL — ABNORMAL HIGH (ref 70–99)
Potassium: 4.2 mmol/L (ref 3.5–5.1)
Sodium: 141 mmol/L (ref 135–145)

## 2019-03-02 SURGERY — INSERTION, INTRAMEDULLARY ROD, TIBIA
Anesthesia: General | Laterality: Right

## 2019-03-02 MED ORDER — SUGAMMADEX SODIUM 200 MG/2ML IV SOLN
INTRAVENOUS | Status: DC | PRN
  Administered 2019-03-02: 116 mg via INTRAVENOUS

## 2019-03-02 MED ORDER — SODIUM CHLORIDE 0.9 % IR SOLN
Status: DC | PRN
  Administered 2019-03-02: 1000 mL

## 2019-03-02 MED ORDER — LISINOPRIL 5 MG PO TABS
2.5000 mg | ORAL_TABLET | Freq: Every day | ORAL | Status: DC
  Administered 2019-03-03: 08:00:00 2.5 mg via ORAL
  Filled 2019-03-02: qty 1

## 2019-03-02 MED ORDER — PHENOL 1.4 % MT LIQD: 1.0000 | OROMUCOSAL | Status: DC | PRN

## 2019-03-02 MED ORDER — FENTANYL CITRATE (PF) 100 MCG/2ML IJ SOLN
INTRAMUSCULAR | Status: AC
  Filled 2019-03-02: qty 2

## 2019-03-02 MED ORDER — SUGAMMADEX SODIUM 500 MG/5ML IV SOLN
INTRAVENOUS | Status: AC
  Filled 2019-03-02: qty 10

## 2019-03-02 MED ORDER — ONDANSETRON HCL 4 MG/2ML IJ SOLN: 4.0000 mg | Freq: Four times a day (QID) | INTRAMUSCULAR | Status: DC | PRN

## 2019-03-02 MED ORDER — ONDANSETRON HCL 4 MG PO TABS: 4.0000 mg | ORAL_TABLET | Freq: Four times a day (QID) | ORAL | Status: DC | PRN

## 2019-03-02 MED ORDER — PROPOFOL 10 MG/ML IV BOLUS
INTRAVENOUS | Status: DC | PRN
  Administered 2019-03-02: 90 mg via INTRAVENOUS

## 2019-03-02 MED ORDER — EPHEDRINE SULFATE 50 MG/ML IJ SOLN
INTRAMUSCULAR | Status: DC | PRN
  Administered 2019-03-02: 10 mg via INTRAVENOUS

## 2019-03-02 MED ORDER — ROCURONIUM BROMIDE 10 MG/ML (PF) SYRINGE
PREFILLED_SYRINGE | INTRAVENOUS | Status: AC
  Filled 2019-03-02: qty 10

## 2019-03-02 MED ORDER — METOCLOPRAMIDE HCL 5 MG/ML IJ SOLN: 5.0000 mg | Freq: Three times a day (TID) | INTRAMUSCULAR | Status: DC | PRN

## 2019-03-02 MED ORDER — METFORMIN HCL 500 MG PO TABS
1000.0000 mg | ORAL_TABLET | Freq: Two times a day (BID) | ORAL | Status: DC
  Administered 2019-03-03: 1000 mg via ORAL
  Filled 2019-03-02: qty 2

## 2019-03-02 MED ORDER — BUPIVACAINE-EPINEPHRINE (PF) 0.5% -1:200000 IJ SOLN
INTRAMUSCULAR | Status: DC | PRN
  Administered 2019-03-02: 60 mL

## 2019-03-02 MED ORDER — PANTOPRAZOLE SODIUM 40 MG PO TBEC
40.0000 mg | DELAYED_RELEASE_TABLET | Freq: Every day | ORAL | Status: DC
  Administered 2019-03-03: 08:00:00 40 mg via ORAL
  Filled 2019-03-02: qty 1

## 2019-03-02 MED ORDER — ROCURONIUM 10MG/ML (10ML) SYRINGE FOR MEDFUSION PUMP - OPTIME
INTRAVENOUS | Status: DC | PRN
  Administered 2019-03-02: 10 mg via INTRAVENOUS
  Administered 2019-03-02: 30 mg via INTRAVENOUS
  Administered 2019-03-02: 10 mg via INTRAVENOUS

## 2019-03-02 MED ORDER — ONDANSETRON HCL 4 MG/2ML IJ SOLN
INTRAMUSCULAR | Status: DC | PRN
  Administered 2019-03-02: 4 mg via INTRAVENOUS

## 2019-03-02 MED ORDER — MENTHOL 3 MG MT LOZG: 1.0000 | LOZENGE | OROMUCOSAL | Status: DC | PRN

## 2019-03-02 MED ORDER — INSULIN GLARGINE 100 UNIT/ML ~~LOC~~ SOLN
50.0000 [IU] | Freq: Every day | SUBCUTANEOUS | Status: DC
  Administered 2019-03-03: 14:00:00 50 [IU] via SUBCUTANEOUS
  Filled 2019-03-02 (×2): qty 0.5

## 2019-03-02 MED ORDER — MORPHINE SULFATE (PF) 2 MG/ML IV SOLN: 0.5000 mg | INTRAVENOUS | Status: DC | PRN

## 2019-03-02 MED ORDER — DOCUSATE SODIUM 100 MG PO CAPS
100.0000 mg | ORAL_CAPSULE | Freq: Two times a day (BID) | ORAL | Status: DC
  Administered 2019-03-02 – 2019-03-03 (×2): 100 mg via ORAL
  Filled 2019-03-02 (×2): qty 1

## 2019-03-02 MED ORDER — CEFAZOLIN SODIUM-DEXTROSE 2-4 GM/100ML-% IV SOLN
2.0000 g | Freq: Four times a day (QID) | INTRAVENOUS | Status: AC
  Administered 2019-03-02 – 2019-03-03 (×2): 2 g via INTRAVENOUS
  Filled 2019-03-02 (×2): qty 100

## 2019-03-02 MED ORDER — HYDROCODONE-ACETAMINOPHEN 5-325 MG PO TABS
1.0000 | ORAL_TABLET | ORAL | Status: DC | PRN
  Administered 2019-03-02 – 2019-03-03 (×3): 1 via ORAL
  Filled 2019-03-02: qty 2
  Filled 2019-03-02 (×2): qty 1

## 2019-03-02 MED ORDER — LACTATED RINGERS IV SOLN
INTRAVENOUS | Status: DC | PRN
  Administered 2019-03-02 (×2): via INTRAVENOUS

## 2019-03-02 MED ORDER — SODIUM CHLORIDE 0.9 % IV SOLN: INTRAVENOUS | Status: AC

## 2019-03-02 MED ORDER — MEMANTINE HCL-DONEPEZIL HCL 28-10 MG PO CP24
1.0000 | ORAL_CAPSULE | Freq: Every day | ORAL | Status: DC
Start: 2019-03-02 — End: 2019-03-02

## 2019-03-02 MED ORDER — BUPIVACAINE-EPINEPHRINE (PF) 0.5% -1:200000 IJ SOLN
INTRAMUSCULAR | Status: AC
  Filled 2019-03-02: qty 60

## 2019-03-02 MED ORDER — PIOGLITAZONE HCL 15 MG PO TABS
30.0000 mg | ORAL_TABLET | Freq: Every day | ORAL | Status: DC
  Administered 2019-03-03: 08:00:00 30 mg via ORAL
  Filled 2019-03-02 (×2): qty 1

## 2019-03-02 MED ORDER — ACETAMINOPHEN 500 MG PO TABS
500.0000 mg | ORAL_TABLET | Freq: Four times a day (QID) | ORAL | Status: AC
  Administered 2019-03-02 – 2019-03-03 (×4): 500 mg via ORAL
  Filled 2019-03-02 (×4): qty 1

## 2019-03-02 MED ORDER — DONEPEZIL HCL 5 MG PO TABS
10.0000 mg | ORAL_TABLET | Freq: Every day | ORAL | Status: DC
  Administered 2019-03-02: 10 mg via ORAL
  Filled 2019-03-02: qty 2

## 2019-03-02 MED ORDER — POVIDONE-IODINE 10 % EX SWAB: 2.0000 "application " | Freq: Once | CUTANEOUS | Status: DC

## 2019-03-02 MED ORDER — SENNOSIDES-DOCUSATE SODIUM 8.6-50 MG PO TABS: 1.0000 | ORAL_TABLET | Freq: Every evening | ORAL | Status: DC | PRN

## 2019-03-02 MED ORDER — SUCCINYLCHOLINE 20MG/ML (10ML) SYRINGE FOR MEDFUSION PUMP - OPTIME
INTRAMUSCULAR | Status: DC | PRN
  Administered 2019-03-02: 80 mg via INTRAVENOUS

## 2019-03-02 MED ORDER — METOCLOPRAMIDE HCL 10 MG PO TABS: 5.0000 mg | ORAL_TABLET | Freq: Three times a day (TID) | ORAL | Status: DC | PRN

## 2019-03-02 MED ORDER — INFLUENZA VAC A&B SA ADJ QUAD 0.5 ML IM PRSY: 0.5000 mL | PREFILLED_SYRINGE | INTRAMUSCULAR | Status: DC | PRN

## 2019-03-02 MED ORDER — FENTANYL CITRATE (PF) 100 MCG/2ML IJ SOLN
INTRAMUSCULAR | Status: DC | PRN
  Administered 2019-03-02 (×6): 25 ug via INTRAVENOUS

## 2019-03-02 MED ORDER — CEFAZOLIN SODIUM-DEXTROSE 2-4 GM/100ML-% IV SOLN
2.0000 g | INTRAVENOUS | Status: AC
  Administered 2019-03-02: 2 g via INTRAVENOUS
  Filled 2019-03-02: qty 100

## 2019-03-02 SURGICAL SUPPLY — 57 items
BANDAGE ELASTIC 4 VELCRO NS (GAUZE/BANDAGES/DRESSINGS) ×1 IMPLANT
BANDAGE ELASTIC 6 VELCRO NS (GAUZE/BANDAGES/DRESSINGS) ×1 IMPLANT
BANDAGE ESMARK 4X12 BL STRL LF (DISPOSABLE) ×1 IMPLANT
BIT DRILL CALIBRATED 4.2 (BIT) IMPLANT
BIT DRILL SHORT 4.2 (BIT) IMPLANT
BNDG ESMARK 4X12 BLUE STRL LF (DISPOSABLE) ×2
CAP END 10MM TI GRAY (Cap) IMPLANT
CAP END TI GRAY 10MM (Cap) ×1 IMPLANT
CHLORAPREP W/TINT 26 (MISCELLANEOUS) ×2 IMPLANT
CLOTH BEACON ORANGE TIMEOUT ST (SAFETY) ×2 IMPLANT
COVER LIGHT HANDLE STERIS (MISCELLANEOUS) ×8 IMPLANT
COVER WAND RF STERILE (DRAPES) ×1 IMPLANT
CUFF TOURN SGL QUICK 18X4 (TOURNIQUET CUFF) ×1 IMPLANT
DRAPE C-ARM FOLDED MOBILE STRL (DRAPES) ×2 IMPLANT
DRAPE HALF SHEET 40X57 (DRAPES) ×5 IMPLANT
DRILL BIT CALIBRATED 4.2 (BIT) ×2
DRILL BIT SHORT 4.2 (BIT) ×1
GAUZE SPONGE 4X4 12PLY STRL (GAUZE/BANDAGES/DRESSINGS) ×2 IMPLANT
GAUZE XEROFORM 5X9 LF (GAUZE/BANDAGES/DRESSINGS) ×2 IMPLANT
GLOVE BIO SURGEON STRL SZ7 (GLOVE) ×1 IMPLANT
GLOVE BIOGEL PI IND STRL 7.0 (GLOVE) ×1 IMPLANT
GLOVE BIOGEL PI INDICATOR 7.0 (GLOVE) ×3
GLOVE ECLIPSE 6.5 STRL STRAW (GLOVE) ×1 IMPLANT
GLOVE SKINSENSE NS SZ8.0 LF (GLOVE) ×2
GLOVE SKINSENSE STRL SZ8.0 LF (GLOVE) ×1 IMPLANT
GLOVE SS N UNI LF 8.5 STRL (GLOVE) ×2 IMPLANT
GOWN STRL REUS W/TWL LRG LVL3 (GOWN DISPOSABLE) ×8 IMPLANT
GOWN STRL REUS W/TWL XL LVL3 (GOWN DISPOSABLE) ×2 IMPLANT
GUIDEWIRE 3.2X400 (WIRE) ×1 IMPLANT
INST SET MAJOR BONE (KITS) ×2 IMPLANT
KIT TURNOVER KIT A (KITS) ×2 IMPLANT
MANIFOLD NEPTUNE II (INSTRUMENTS) ×2 IMPLANT
NAIL TIB 10X285 (Nail) ×1 IMPLANT
NS IRRIG 1000ML POUR BTL (IV SOLUTION) ×3 IMPLANT
PACK BASIC LIMB (CUSTOM PROCEDURE TRAY) ×2 IMPLANT
PAD ABD 5X9 TENDERSORB (GAUZE/BANDAGES/DRESSINGS) ×2 IMPLANT
PAD ARMBOARD 7.5X6 YLW CONV (MISCELLANEOUS) ×2 IMPLANT
PAD CAST 4YDX4 CTTN HI CHSV (CAST SUPPLIES) IMPLANT
PADDING CAST COTTON 4X4 STRL (CAST SUPPLIES) ×2
PADDING CAST COTTON 6X4 STRL (CAST SUPPLIES) ×1 IMPLANT
REAMER ROD DEEP FLUTE 2.5X950 (INSTRUMENTS) ×1 IMPLANT
SCREW LOCK STAR 5X26 (Screw) ×1 IMPLANT
SCREW LOCK STAR 5X30 (Screw) ×1 IMPLANT
SCREW LOCK STAR 5X32 (Screw) ×1 IMPLANT
SCREW LOCK STAR 5X34 (Screw) ×1 IMPLANT
SET BASIN LINEN APH (SET/KITS/TRAYS/PACK) ×2 IMPLANT
SPONGE LAP 18X18 RF (DISPOSABLE) ×2 IMPLANT
STAPLER VISISTAT 35W (STAPLE) ×2 IMPLANT
SUT BRALON NAB BRD #1 30IN (SUTURE) ×1 IMPLANT
SUT ETHILON 3 0 FSL (SUTURE) ×1 IMPLANT
SUT MON AB 0 CT1 (SUTURE) ×1 IMPLANT
SUT MON AB 2-0 CT1 36 (SUTURE) ×1 IMPLANT
SUT VIC AB 1 CT1 27 (SUTURE) ×2
SUT VIC AB 1 CT1 27XBRD ANTBC (SUTURE) IMPLANT
SYR BULB IRRIGATION 50ML (SYRINGE) ×2 IMPLANT
TRAY FOLEY MTR SLVR 16FR STAT (SET/KITS/TRAYS/PACK) ×1 IMPLANT
YANKAUER SUCT BULB TIP NO VENT (SUCTIONS) ×1 IMPLANT

## 2019-03-02 NOTE — Anesthesia Preprocedure Evaluation (Signed)
Anesthesia Evaluation  Patient identified by MRN, date of birth, ID band Patient awake    Reviewed: Allergy & Precautions, NPO status , Patient's Chart, lab work & pertinent test results  Airway Mallampati: I  TM Distance: >3 FB Neck ROM: Full    Dental no notable dental hx. (+) Edentulous Upper, Edentulous Lower   Pulmonary neg pulmonary ROS,    Pulmonary exam normal breath sounds clear to auscultation       Cardiovascular Exercise Tolerance: Good hypertension, Pt. on medications negative cardio ROS Normal cardiovascular examII Rhythm:Regular Rate:Normal     Neuro/Psych PSYCHIATRIC DISORDERS Dementia Pleasant  negative neurological ROS     GI/Hepatic negative GI ROS, Neg liver ROS,   Endo/Other  negative endocrine ROSdiabetes, Well Controlled, Type 2, Oral Hypoglycemic Agents  Renal/GU negative Renal ROS  negative genitourinary   Musculoskeletal negative musculoskeletal ROS (+)   Abdominal   Peds negative pediatric ROS (+)  Hematology negative hematology ROS (+)   Anesthesia Other Findings   Reproductive/Obstetrics negative OB ROS                             Anesthesia Physical Anesthesia Plan  ASA: III  Anesthesia Plan: General   Post-op Pain Management:    Induction: Intravenous  PONV Risk Score and Plan: 3 and Treatment may vary due to age or medical condition, Ondansetron and Dexamethasone  Airway Management Planned: Oral ETT  Additional Equipment:   Intra-op Plan:   Post-operative Plan: Extubation in OR  Informed Consent: I have reviewed the patients History and Physical, chart, labs and discussed the procedure including the risks, benefits and alternatives for the proposed anesthesia with the patient or authorized representative who has indicated his/her understanding and acceptance.     Dental advisory given  Plan Discussed with: CRNA  Anesthesia Plan  Comments: (Plan Full PPE use  Plan GETA -WTP with same after Q&A  D/w Niece/POA  Garfield Cornea -consented for GETA witnessed by RN D/w POA unlikely possibililty of postop ventilation as needed -VU, WTP with GETA)        Anesthesia Quick Evaluation

## 2019-03-02 NOTE — Anesthesia Procedure Notes (Signed)
Procedure Name: Intubation Date/Time: 03/02/2019 1:15 PM Performed by: Ollen Bowl, CRNA Pre-anesthesia Checklist: Patient identified, Patient being monitored, Timeout performed, Emergency Drugs available and Suction available Patient Re-evaluated:Patient Re-evaluated prior to induction Oxygen Delivery Method: Circle system utilized Preoxygenation: Pre-oxygenation with 100% oxygen Induction Type: IV induction Ventilation: Mask ventilation without difficulty Laryngoscope Size: Mac and 3 Grade View: Grade I Tube type: Oral Tube size: 6.5 mm Number of attempts: 1 Airway Equipment and Method: Stylet Placement Confirmation: ETT inserted through vocal cords under direct vision,  positive ETCO2 and breath sounds checked- equal and bilateral Secured at: 21 cm Tube secured with: Tape Dental Injury: Teeth and Oropharynx as per pre-operative assessment

## 2019-03-02 NOTE — Op Note (Signed)
03/02/2019  4:06 PM  PATIENT:  Cynthia Hanson  85 y.o. female  PRE-OPERATIVE DIAGNOSIS:  closed right tibial fracture   POST-OPERATIVE DIAGNOSIS:  closed right tibial fracture   Findings spiral comminuted fracture closed right tibia with fibula fractures proximal and distal Large blister noted medial distal soft tissue   PROCEDURE:  Procedure(s): INTRAMEDULLARY (IM) NAIL TIBIAL (Right)   SYNTHES NAIL 285 X 10 , 2 SCREWS PROXIMAL AND 2 SCREWS DISTAL   SURGEON:  Surgeon(s) and Role:    Carole Civil, MD - Primary  Surgical dictation The patient's surgical site was marked and she was taken to surgery she had appropriate antibiotics general anesthesia she was placed on a regular table.  The C-arm was brought in to assure that we could get the proper images.  In the reduction maneuver which was traction external rotation and varus  The leg was then prepped and draped sterilely Timeout was completed  The knee was placed in approximately 90 degrees of flexion a paramedial incision was made subcutaneous tissue was divided peritenon was split incision was made in the medial retinaculum and medial to the patellar tendon AND divided sharply.  The fat pad was moved moved and the patella was subluxated laterally  An awl was used to enter the tibial canal this was checked on x-ray and then a guidewire was placed and then the reduction was performed and a guidewire was passed to the center of the ankle joint.  Serial reaming starting with a 9 then a 10 and then an 11 were passed under C arm guidance  The nail measured between 285 and 300 mm I took a 285 mm nail to assure that I had some room to manipulate the nail length and the bone  We passed the nail over the guidewire we remove the guidewire we placed 2 proximal locking screws through an incision through skin subcu and blunt dissection down to bone this was checked by x-ray  I then placed the leg on bumps and then got to perfect  circles made another incision and divided subcutaneous tissue blunt dissection and then 2 screws placed confirmed by x-ray  Final images show that the tibia fracture was reduced hardware was in good position  All images were thoroughly irrigated we closed the proximal wound with #1 Vicryl for the retinaculum 2-0 Monocryl and staples we injected the knee with 60 cc of Marcaine  We closed the other incisions with combinations of 2-0 Monocryl 3-0 nylon and staples  The limb was dressed from toes to mid thigh with 4 x 4's ABD sterile web roll and Ace bandages  Weightbearing status as tolerated  PHYSICIAN ASSISTANT:   ASSISTANTS: none , ANGELA WHITT 2 ND SCRUB TECH   ANESTHESIA:   general  EBL:  100 mL   BLOOD ADMINISTERED:none  DRAINS: none   LOCAL MEDICATIONS USED:  MARCAINE    and Amount: 60 ml  SPECIMEN:  No Specimen  DISPOSITION OF SPECIMEN:  N/A  COUNTS:  YES  TOURNIQUET:  * Missing tourniquet times found for documented tourniquets in log: [id number] *  DICTATION: .Viviann Spare Dictation  PLAN OF CARE: Admit to inpatient   PATIENT DISPOSITION:  PACU - hemodynamically stable.   Delay start of Pharmacological VTE agent (>24hrs) due to surgical blood loss or risk of bleeding: no

## 2019-03-02 NOTE — Transfer of Care (Signed)
Immediate Anesthesia Transfer of Care Note  Patient: Cynthia Hanson  Procedure(s) Performed: INTRAMEDULLARY (IM) NAIL TIBIAL (Right )  Patient Location: PACU  Anesthesia Type:General  Level of Consciousness: awake, drowsy and patient cooperative  Airway & Oxygen Therapy: Patient Spontanous Breathing and Patient connected to face mask oxygen  Post-op Assessment: Report given to RN and Post -op Vital signs reviewed and stable  Post vital signs: Reviewed and stable  Last Vitals:  Vitals Value Taken Time  BP 147/66 03/02/19 1600  Temp    Pulse 81 03/02/19 1603  Resp 15 03/02/19 1603  SpO2 100 % 03/02/19 1603  Vitals shown include unvalidated device data.  Last Pain:  Vitals:   03/02/19 1551  TempSrc:   PainSc: (P) Asleep      Patients Stated Pain Goal: 3 (94/80/16 5537)  Complications: No apparent anesthesia complications

## 2019-03-02 NOTE — Progress Notes (Signed)
Patient Demographics:    Cynthia Hanson, is a 85 y.o. female, DOB - 06/29/1932, ZOX:[id number]  Admit date - 02/28/2019   Admitting Physician Pearson GrippeJames Kim, MD  Outpatient [facility] the [facility]  LOS - 2   Chief Complaint  Patient presents with  . Leg Injury        Subjective:    Cynthia Hanson today has no fevers, no emesis,  No chest pain, Niece Harvest Forest(Lisa Phelps) at bedside, questions answered  Assessment  & Plan :    Principal Problem:   Tibial fracture Active Problems:   Dementia (HCC)   Essential hypertension   Hyperlipidemia   Diabetes (HCC)     1)Right closed tibial shaft fracture-status post fall on 02/28/2019, status post ORIF on 03/02/2019--other management per orthopedic team  2)Dementia Stable, at baseline patient with significant cognitive deficits, cont Aricept 10mg  po qday, Cont Namenda XR 28mg  po qday  3)Hypertension Cont Lisinopril 2.5mg  po qday  Hyperlipidemia Cont Lipitor 20mg  po qhs  Dm2 -Currently on Lantus 50 units daily,, along with sliding scale coverage C/n  Metformin and actos  Disposition/Need for in-Hospital Stay- patient unable to be discharged at this time due to s/p ORIF of right tibial fracture will most likely need SNF rehab*  Code Status : Full  Family Communication:   Discussed with Niece Harvest Forest(Lisa Phelps) at bedside,   Disposition Plan  : SNF Vs HH   Consults  :  ortho  DVT Prophylaxis  :  Lovenox - Heparin - SCDs   Lab Results  Component Value Date   PLT 240 03/02/2019    Inpatient Medications  Scheduled Meds: . acetaminophen  500 mg Oral Q6H  . atorvastatin  20 mg Oral q1800  . docusate sodium  100 mg Oral BID  . donepezil  10 mg Oral QHS  . enoxaparin (LOVENOX) injection  40 mg Subcutaneous Q24H  . insulin aspart  0-9 Units Subcutaneous Q4H  . [START ON 02/12/2019] insulin glargine  50 Units Subcutaneous Daily   . lisinopril  2.5 mg Oral Daily  . memantine  28 mg Oral Daily  . metFORMIN  1,000 mg Oral BID WC  . pantoprazole  40 mg Oral Daily  . [START ON 02/15/2019] pioglitazone  30 mg Oral Daily  . sodium chloride flush  3 mL Intravenous Q12H   Continuous Infusions: . sodium chloride    . sodium chloride    .  ceFAZolin (ANCEF) IV     PRN Meds:.sodium chloride, acetaminophen **OR** acetaminophen, diphenhydrAMINE, HYDROcodone-acetaminophen, HYDROmorphone (DILAUDID) injection, influenza vaccine adjuvanted, menthol-cetylpyridinium **OR** phenol, metoCLOPramide **OR** metoCLOPramide (REGLAN) injection, morphine injection, ondansetron **OR** ondansetron (ZOFRAN) IV, senna-docusate, sodium chloride flush    Anti-infectives (From admission, onward)   Start     Dose/Rate Route Frequency Ordered Stop   03/02/19 2000  ceFAZolin (ANCEF) IVPB 2g/100 mL premix     2 g 200 mL/hr over 30 Minutes Intravenous Every 6 hours 03/02/19 1655 02/12/2019 0759   03/02/19 1000  ceFAZolin (ANCEF) IVPB 2g/100 mL premix     2 g 200 mL/hr over 30 Minutes Intravenous On call to O.R. 03/02/19 0951 03/02/19 1310        Objective:   Vitals:   03/02/19 1601 03/02/19 1614  03/02/19 1615 03/02/19 1647  BP: (!) 147/66  (!) 144/61 (!) 127/53  Pulse: 78 80 81 74  Resp: 18 (!) Temp:      TempSrc:      SpO2: 100% 100% 100% 99%  Weight:      Height:        Wt Readings from Last 3 Encounters:  03/01/19 58 kg     Intake/Output Summary (Last 24 hours) at 03/02/2019 1828 Last data filed at 03/02/2019 1603 Gross per 24 hour  Intake 1200 ml  Output 900 ml  Net 300 ml     Physical Exam  Gen:- Awake Alert,  HEENT:- [place].AT, No sclera icterus Neck-Supple Neck,No JVD,.  Lungs-  CTAB , fair symmetrical air movement CV- S1, S2 normal, regular  Abd-  +ve B.Sounds, Abd Soft, No tenderness,    Extremity/Skin:- No  edema, pedal pulses present  Psych-cognitive and memory deficits at baseline  neuro-generalized  weakness, no new focal deficits, no tremors MSK-right lower extremity with splint   Data Review:   Micro Results Recent Results (from the past 240 hour(s))  SARS Coronavirus 2 [facility] order, Performed in [facility] hospital lab) Nasopharyngeal Nasopharyngeal Swab     Status: None   Collection Time: 02/28/19  7:16 PM   Specimen: Nasopharyngeal Swab  Result Value Ref Range Status   SARS Coronavirus 2 NEGATIVE NEGATIVE Final    Comment: (NOTE) If result is NEGATIVE SARS-CoV-2 target nucleic acids are NOT DETECTED. The SARS-CoV-2 RNA is generally detectable in upper and lower  respiratory specimens during the acute phase of infection. The lowest  concentration of SARS-CoV-2 viral copies this assay can detect is 250  copies / mL. A negative result does not preclude SARS-CoV-2 infection  and should not be used as the sole basis for treatment or other  patient management decisions.  A negative result may occur with  improper specimen collection / handling, submission of specimen other  than nasopharyngeal swab, presence of viral mutation(s) within the  areas targeted by this assay, and inadequate number of viral copies  (<250 copies / mL). A negative result must be combined with clinical  observations, patient history, and epidemiological information. If result is POSITIVE SARS-CoV-2 target nucleic acids are DETECTED. The SARS-CoV-2 RNA is generally detectable in upper and lower  respiratory specimens dur ing the acute phase of infection.  Positive  results are indicative of active infection with SARS-CoV-2.  Clinical  correlation with patient history and other diagnostic information is  necessary to determine patient infection status.  Positive results do  not rule out bacterial infection or co-infection with other viruses. If result is PRESUMPTIVE POSTIVE SARS-CoV-2 nucleic acids MAY BE PRESENT.   A presumptive positive result was obtained on the submitted specimen  and confirmed  on repeat testing.  While 2019 novel coronavirus  (SARS-CoV-2) nucleic acids may be present in the submitted sample  additional confirmatory testing may be necessary for epidemiological  and / or clinical management purposes  to differentiate between  SARS-CoV-2 and other Sarbecovirus currently known to infect humans.  If clinically indicated additional testing with an alternate test  methodology [phone]) is advised. The SARS-CoV-2 RNA is generally  detectable in upper and lower respiratory sp ecimens during the acute  phase of infection. The expected result is Negative. Fact Sheet for Patients:  BoilerBrush.com.cy Fact Sheet for Healthcare Providers: https://pope.com/ This test is not yet approved or cleared by the [place] FDA and has been authorized for  detection and/or diagnosis of SARS-CoV-2 by FDA under an Emergency Use Authorization (EUA).  This EUA will remain in effect (meaning this test can be used) for the duration of the COVID-19 declaration under Section 564(b)(1) of the Act, 21 U.S.C. section 360bbb-3(b)(1), unless the authorization is terminated or revoked sooner. Performed at [facility], [address] [place], [place] [address]   Surgical pcr screen     Status: None   Collection Time: 03/01/19 12:26 AM   Specimen: Nasal Mucosa; Nasal Swab  Result Value Ref Range Status   MRSA, PCR NEGATIVE NEGATIVE Final   Staphylococcus aureus NEGATIVE NEGATIVE Final    Comment: (NOTE) The Xpert SA Assay (FDA approved for NASAL specimens in patients 67 years of age and older), is one component of a comprehensive surveillance program. It is not intended to diagnose infection nor to guide or monitor treatment. Performed at [facility], [address], [place], [place] [address]     Radiology Reports Dg Tibia/fibula Right  Result Date: 03/02/2019 CLINICAL DATA:  Tibial fractures post fixation. EXAM: DG C-ARM 1-60 MIN; RIGHT  TIBIA AND FIBULA - 2 VIEW CONTRAST:  None. FLUOROSCOPY TIME:  Fluoroscopy Time:  4 minutes 9 seconds. Radiation Exposure Index (if provided by the fluoroscopic device): 18.82 mGy Number of Acquired Spot Images: 12 COMPARISON:  02/28/2019 FINDINGS: Fixation of patient's distal diaphyseal fracture with intramedullary nail and associated screws. Hardware is intact as there is near anatomic alignment over the fracture site. Evidence of patient's known proximal and distal fibular fracture. Recommend correlation with findings at the time of the procedure. IMPRESSION: Placement of intramedullary nail bridging patient's distal tibial diaphyseal fracture with hardware intact and near anatomic alignment over the fracture site. Evidence of patient's known proximal and distal fibular fractures. Electronically Signed   By: Elberta Fortis M.D.   On: 03/02/2019 15:49   Dg Tibia/fibula Right  Result Date: 02/28/2019 CLINICAL DATA:  PT brought in by [facility] from home with family today. EMS reports pt was viewed turning around while walking and her right lower leg felt a pop and she fell to the ground. Obvious fracture noted to right lower leg weak EXAM: RIGHT TIBIA AND FIBULA - 2 VIEW COMPARISON:  None. FINDINGS: Oblique fractures through the distal tibial diaphysis with 1 bone with posterior displacement of the distal fracture fragment seen on cross-table projection. Mild override. Oblique fracture through the distal fibula just above the metaphysis. Additional fracture of the proximal fibula at the upper third of the diaphysis with several mm of displacement ( 4 mm). IMPRESSION: 1. Oblique fracture through the distal tibial diaphysis with 1 bone width displacement and mild override. 2. Twp oblique fractures through the diaphysis of the fibula. Electronically Signed   By: Genevive Bi M.D.   On: 02/28/2019 17:44   Dg Pelvis Portable  Result Date: 02/28/2019 CLINICAL DATA:  PT brought in by [facility] from home with family  today. EMS reports pt was viewed turning around while walking and her right lower leg felt a pop and she fell to the ground. Obvious fracture noted to right lower leg weak EXAM: PORTABLE PELVIS 1-2 VIEWS COMPARISON:  None. FINDINGS: Hips are located. No pelvic fracture. No femoral neck fracture on AP view IMPRESSION: No pelvic fracture or hip fracture.  No dislocation Electronically Signed   By: Genevive Bi M.D.   On: 02/28/2019 17:40   Dg Chest Port 1 View  Result Date: 02/28/2019 CLINICAL DATA:  PT brought in by [facility] from home with  family today. EMS reports pt was viewed turning around while walking and her right lower leg felt a pop and she fell to the ground. Obvious fracture noted to right lower legdata EXAM: PORTABLE CHEST 1 VIEW COMPARISON:  Radiograph 07/19/2018 FINDINGS: Normal mediastinum and cardiac silhouette. Normal pulmonary vasculature. No evidence of effusion, infiltrate, or pneumothorax. No acute bony abnormality. IMPRESSION: No acute cardiopulmonary process. Electronically Signed   By: Suzy Bouchard M.D.   On: 02/28/2019 17:39   Dg C-arm 1-60 Min  Result Date: 03/02/2019 CLINICAL DATA:  Tibial fractures post fixation. EXAM: DG C-ARM 1-60 MIN; RIGHT TIBIA AND FIBULA - 2 VIEW CONTRAST:  None. FLUOROSCOPY TIME:  Fluoroscopy Time:  4 minutes 9 seconds. Radiation Exposure Index (if provided by the fluoroscopic device): 18.82 mGy Number of Acquired Spot Images: 12 COMPARISON:  02/28/2019 FINDINGS: Fixation of patient's distal diaphyseal fracture with intramedullary nail and associated screws. Hardware is intact as there is near anatomic alignment over the fracture site. Evidence of patient's known proximal and distal fibular fracture. Recommend correlation with findings at the time of the procedure. IMPRESSION: Placement of intramedullary nail bridging patient's distal tibial diaphyseal fracture with hardware intact and near anatomic alignment over the fracture site. Evidence of  patient's known proximal and distal fibular fractures. Electronically Signed   By: Marin Olp M.D.   On: 03/02/2019 15:49     CBC Recent Labs  Lab 02/28/19 2003 03/01/19 0619 03/02/19 0440  WBC 12.1* 8.1 7.7  HGB 11.2* 10.1* 10.0*  HCT 36.7 33.5* 33.3*  PLT 297 254 240  MCV 85.9 85.9 85.8  MCH 26.2 25.9* 25.8*  MCHC 30.5 30.1 30.0  RDW 15.9* 16.0* 15.9*  LYMPHSABS 0.7  --   --   MONOABS 1.0  --   --   EOSABS 0.0  --   --   BASOSABS 0.1  --   --     Chemistries  Recent Labs  Lab 02/28/19 2003 03/01/19 0619 03/02/19 0440  NA 140 140 141  K 4.2 3.8 4.2  CL 103 107 105  CO2 25 25 27   GLUCOSE 154* 109* 103*  BUN 22 22 18   CREATININE 0.92 0.79 0.73  CALCIUM 9.6 9.1 9.1  AST 22 18  --   ALT 12 12  --   ALKPHOS 84 79  --   BILITOT 0.5 0.7  --    ------------------------------------------------------------------------------------------------------------------ No results for input(s): CHOL, HDL, LDLCALC, TRIG, CHOLHDL, LDLDIRECT in the last 72 hours.  No results found for: HGBA1C ------------------------------------------------------------------------------------------------------------------ No results for input(s): TSH, T4TOTAL, T3FREE, THYROIDAB in the last 72 hours.  Invalid input(s): FREET3 ------------------------------------------------------------------------------------------------------------------ No results for input(s): VITAMINB12, FOLATE, FERRITIN, TIBC, IRON, RETICCTPCT in the last 72 hours.  Coagulation profile Recent Labs  Lab 03/01/19 0619  INR 1.1    No results for input(s): DDIMER in the last 72 hours.  Cardiac Enzymes No results for input(s): CKMB, TROPONINI, MYOGLOBIN in the last 168 hours.  Invalid input(s): CK ------------------------------------------------------------------------------------------------------------------ No results found for: BNP   Roxan Hockey M.D on 03/02/2019 at 6:28 PM  Go to [email or web address] - for contact  info  [facility] - Office  [phone]

## 2019-03-02 NOTE — Interval H&P Note (Signed)
History and Physical Interval Note:  03/02/2019 12:53 PM  BP (!) 144/61 (BP Location: Left Arm)   Pulse 78   Temp 98.8 F (37.1 C)   Resp 18   Ht 5\' 1"  (1.549 m)   Wt 58 kg   SpO2 98%   BMI 24.16 kg/m   CBC Latest Ref Rng & Units 03/02/2019 03/01/2019 02/28/2019  WBC 4.0 - 10.5 K/uL 7.7 8.1 12.1(H)  Hemoglobin 12.0 - 15.0 g/dL 10.0(L) 10.1(L) 11.2(L)  Hematocrit 36.0 - 46.0 % 33.3(L) 33.5(L) 36.7  Platelets 150 - 400 K/uL 240 Cynthia Hanson  has presented today for surgery, with the diagnosis of closed right tibial nail.  The various methods of treatment have been discussed with the patient and family. After consideration of risks, benefits and other options for treatment, the patient has consented to  Procedure(s): INTRAMEDULLARY (IM) NAIL TIBIAL (Right) as a surgical intervention.  The patient's history has been reviewed, patient examined, no change in status, stable for surgery.  I have reviewed the patient's chart and labs.  Questions were answered to the patient's satisfaction.     Arther Abbott

## 2019-03-02 NOTE — H&P (View-Only) (Signed)
Patient ID: Cynthia Hanson, female   DOB: 1933-02-14, 85 y.o.   MRN: [id number] Right closed tibial shaft fracture 02/28/2019 hospital date 3  Scheduled for close nailing right tibia  BP (!) 103/54   Pulse 71   Temp 98.2 F (36.8 C) (Oral)   Resp 18   Ht 5\' 1"  (1.549 m)   Wt 58 kg   SpO2 100%   BMI 24.16 kg/m   CBC Latest Ref Rng & Units 03/02/2019 03/01/2019 02/28/2019  WBC 4.0 - 10.5 K/uL 7.7 8.1 12.1(H)  Hemoglobin 12.0 - 15.0 g/dL 10.0(L) 10.1(L) 11.2(L)  Hematocrit 36.0 - 46.0 % 33.3(L) 33.5(L) 36.7  Platelets 150 - 400 K/uL 240 254 297   BMP Latest Ref Rng & Units 03/02/2019 03/01/2019 02/28/2019  Glucose 70 - 99 mg/dL 103(H) 109(H) 154(H)  BUN 8 - 23 mg/dL 18 22 22   Creatinine 0.44 - 1.00 mg/dL 0.73 0.79 0.92  Sodium 135 - 145 mmol/L 141 140 140  Potassium 3.5 - 5.1 mmol/L 4.2 3.8 4.2  Chloride 98 - 111 mmol/L 105 107 103  CO2 22 - 32 mmol/L 27 25 25   Calcium 8.9 - 10.3 mg/dL 9.1 9.1 9.6    Neurovascular exam normal today splint applied yesterday  Compartments are soft  Scheduled to proceed with surgery this afternoon

## 2019-03-02 NOTE — Anesthesia Postprocedure Evaluation (Signed)
Anesthesia Post Note  Patient: Cynthia Hanson  Procedure(s) Performed: INTRAMEDULLARY (IM) NAIL TIBIAL (Right )  Patient location during evaluation: PACU Anesthesia Type: General Level of consciousness: patient cooperative and awake Pain management: pain level controlled Vital Signs Assessment: post-procedure vital signs reviewed and stable Respiratory status: spontaneous breathing Postop Assessment: no apparent nausea or vomiting Anesthetic complications: no     Last Vitals:  Vitals:   03/02/19 1614 03/02/19 1615  BP:  (!) 144/61  Pulse: 80 81  Resp: (!) 21 18  Temp:    SpO2: 100% 100%    Last Pain:  Vitals:   03/02/19 1619  TempSrc:   PainSc: Asleep                 [place] A

## 2019-03-02 NOTE — Progress Notes (Signed)
Patient ID: Raysa W Mcadory, female   DOB: 06/19/1932, 86 y.o.   MRN: [id number] Right closed tibial shaft fracture 02/28/2019 hospital date 3  Scheduled for close nailing right tibia  BP (!) 103/54   Pulse 71   Temp 98.2 F (36.8 C) (Oral)   Resp 18   Ht 5' 1" (1.549 m)   Wt 58 kg   SpO2 100%   BMI 24.16 kg/m   CBC Latest Ref Rng & Units 03/02/2019 03/01/2019 02/28/2019  WBC 4.0 - 10.5 K/uL 7.7 8.1 12.1(H)  Hemoglobin 12.0 - 15.0 g/dL 10.0(L) 10.1(L) 11.2(L)  Hematocrit 36.0 - 46.0 % 33.3(L) 33.5(L) 36.7  Platelets 150 - 400 K/uL 240 254 297   BMP Latest Ref Rng & Units 03/02/2019 03/01/2019 02/28/2019  Glucose 70 - 99 mg/dL 103(H) 109(H) 154(H)  BUN 8 - 23 mg/dL 18 22 22  Creatinine 0.44 - 1.00 mg/dL 0.73 0.79 0.92  Sodium 135 - 145 mmol/L 141 140 140  Potassium 3.5 - 5.1 mmol/L 4.2 3.8 4.2  Chloride 98 - 111 mmol/L 105 107 103  CO2 22 - 32 mmol/L 27 25 25  Calcium 8.9 - 10.3 mg/dL 9.1 9.1 9.6    Neurovascular exam normal today splint applied yesterday  Compartments are soft  Scheduled to proceed with surgery this afternoon 

## 2019-03-02 NOTE — Brief Op Note (Signed)
03/02/2019  4:06 PM  PATIENT:  Cynthia Hanson  86 y.o. female  PRE-OPERATIVE DIAGNOSIS:  closed right tibial fracture   POST-OPERATIVE DIAGNOSIS:  closed right tibial fracture   Findings spiral comminuted fracture closed right tibia with fibula fractures proximal and distal Large blister noted medial distal soft tissue   PROCEDURE:  Procedure(s): INTRAMEDULLARY (IM) NAIL TIBIAL (Right)   SYNTHES NAIL 285 X 10 , 2 SCREWS PROXIMAL AND 2 SCREWS DISTAL   SURGEON:  Surgeon(s) and Role:    * Lyndall Windt E, MD - Primary  Surgical dictation The patient's surgical site was marked and she was taken to surgery she had appropriate antibiotics general anesthesia she was placed on a regular table.  The C-arm was brought in to assure that we could get the proper images.  In the reduction maneuver which was traction external rotation and varus  The leg was then prepped and draped sterilely Timeout was completed  The knee was placed in approximately 90 degrees of flexion a paramedial incision was made subcutaneous tissue was divided peritenon was split incision was made in the medial retinaculum and medial to the patellar tendon AND divided sharply.  The fat pad was moved moved and the patella was subluxated laterally  An awl was used to enter the tibial canal this was checked on x-ray and then a guidewire was placed and then the reduction was performed and a guidewire was passed to the center of the ankle joint.  Serial reaming starting with a 9 then a 10 and then an 11 were passed under C arm guidance  The nail measured between 285 and 300 mm I took a 285 mm nail to assure that I had some room to manipulate the nail length and the bone  We passed the nail over the guidewire we remove the guidewire we placed 2 proximal locking screws through an incision through skin subcu and blunt dissection down to bone this was checked by x-ray  I then placed the leg on bumps and then got to perfect  circles made another incision and divided subcutaneous tissue blunt dissection and then 2 screws placed confirmed by x-ray  Final images show that the tibia fracture was reduced hardware was in good position  All images were thoroughly irrigated we closed the proximal wound with #1 Vicryl for the retinaculum 2-0 Monocryl and staples we injected the knee with 60 cc of Marcaine  We closed the other incisions with combinations of 2-0 Monocryl 3-0 nylon and staples  The limb was dressed from toes to mid thigh with 4 x 4's ABD sterile web roll and Ace bandages  Weightbearing status as tolerated  PHYSICIAN ASSISTANT:   ASSISTANTS: none , ANGELA WHITT 2 ND SCRUB TECH   ANESTHESIA:   general  EBL:  100 mL   BLOOD ADMINISTERED:none  DRAINS: none   LOCAL MEDICATIONS USED:  MARCAINE    and Amount: 60 ml  SPECIMEN:  No Specimen  DISPOSITION OF SPECIMEN:  N/A  COUNTS:  YES  TOURNIQUET:  * Missing tourniquet times found for documented tourniquets in log: [id number] *  DICTATION: .Dragon Dictation  PLAN OF CARE: Admit to inpatient   PATIENT DISPOSITION:  PACU - hemodynamically stable.   Delay start of Pharmacological VTE agent (>24hrs) due to surgical blood loss or risk of bleeding: no   

## 2019-03-02 NOTE — Care Management Important Message (Signed)
Important Message  Patient Details  Name: Cynthia Hanson MRN: [id number] Date of Birth: [birth year]   Medicare Important Message Given:  Yes(family member contacted, copy placed in patient room)     Tommy Medal 03/02/2019, 2:32 PM

## 2019-03-03 ENCOUNTER — Inpatient Hospital Stay
Admission: RE | Admit: 2019-03-03 | Discharge: 2019-04-05 | Disposition: E | Payer: Medicare Other | Source: Ambulatory Visit | Attending: Internal Medicine | Admitting: Internal Medicine

## 2019-03-03 DIAGNOSIS — Z4789 Encounter for other orthopedic aftercare: Secondary | ICD-10-CM | POA: Diagnosis not present

## 2019-03-03 DIAGNOSIS — Z9181 History of falling: Secondary | ICD-10-CM | POA: Diagnosis not present

## 2019-03-03 DIAGNOSIS — R4020 Unspecified coma: Secondary | ICD-10-CM | POA: Diagnosis not present

## 2019-03-03 DIAGNOSIS — E119 Type 2 diabetes mellitus without complications: Secondary | ICD-10-CM | POA: Diagnosis not present

## 2019-03-03 DIAGNOSIS — F0390 Unspecified dementia without behavioral disturbance: Secondary | ICD-10-CM

## 2019-03-03 DIAGNOSIS — R41841 Cognitive communication deficit: Secondary | ICD-10-CM | POA: Diagnosis not present

## 2019-03-03 DIAGNOSIS — R0689 Other abnormalities of breathing: Secondary | ICD-10-CM | POA: Diagnosis not present

## 2019-03-03 DIAGNOSIS — S82301S Unspecified fracture of lower end of right tibia, sequela: Secondary | ICD-10-CM | POA: Diagnosis not present

## 2019-03-03 DIAGNOSIS — E1159 Type 2 diabetes mellitus with other circulatory complications: Secondary | ICD-10-CM | POA: Diagnosis not present

## 2019-03-03 DIAGNOSIS — Z794 Long term (current) use of insulin: Secondary | ICD-10-CM | POA: Diagnosis not present

## 2019-03-03 DIAGNOSIS — M6281 Muscle weakness (generalized): Secondary | ICD-10-CM | POA: Diagnosis not present

## 2019-03-03 DIAGNOSIS — K219 Gastro-esophageal reflux disease without esophagitis: Secondary | ICD-10-CM | POA: Diagnosis not present

## 2019-03-03 DIAGNOSIS — R262 Difficulty in walking, not elsewhere classified: Secondary | ICD-10-CM | POA: Diagnosis not present

## 2019-03-03 DIAGNOSIS — S82301D Unspecified fracture of lower end of right tibia, subsequent encounter for closed fracture with routine healing: Secondary | ICD-10-CM | POA: Diagnosis not present

## 2019-03-03 DIAGNOSIS — F0150 Vascular dementia without behavioral disturbance: Secondary | ICD-10-CM | POA: Diagnosis not present

## 2019-03-03 DIAGNOSIS — S82831S Other fracture of upper and lower end of right fibula, sequela: Secondary | ICD-10-CM | POA: Diagnosis not present

## 2019-03-03 DIAGNOSIS — I499 Cardiac arrhythmia, unspecified: Secondary | ICD-10-CM | POA: Diagnosis not present

## 2019-03-03 DIAGNOSIS — K5909 Other constipation: Secondary | ICD-10-CM | POA: Diagnosis not present

## 2019-03-03 DIAGNOSIS — I10 Essential (primary) hypertension: Secondary | ICD-10-CM | POA: Diagnosis not present

## 2019-03-03 DIAGNOSIS — D62 Acute posthemorrhagic anemia: Secondary | ICD-10-CM | POA: Diagnosis not present

## 2019-03-03 DIAGNOSIS — E785 Hyperlipidemia, unspecified: Secondary | ICD-10-CM | POA: Diagnosis not present

## 2019-03-03 DIAGNOSIS — R404 Transient alteration of awareness: Secondary | ICD-10-CM | POA: Diagnosis not present

## 2019-03-03 DIAGNOSIS — E1169 Type 2 diabetes mellitus with other specified complication: Secondary | ICD-10-CM | POA: Diagnosis not present

## 2019-03-03 DIAGNOSIS — S82301A Unspecified fracture of lower end of right tibia, initial encounter for closed fracture: Secondary | ICD-10-CM | POA: Diagnosis not present

## 2019-03-03 DIAGNOSIS — Z741 Need for assistance with personal care: Secondary | ICD-10-CM | POA: Diagnosis not present

## 2019-03-03 LAB — CBC
HCT: 28.8 % — ABNORMAL LOW (ref 36.0–46.0)
Hemoglobin: 8.6 g/dL — ABNORMAL LOW (ref 12.0–15.0)
MCH: 25.7 pg — ABNORMAL LOW (ref 26.0–34.0)
MCHC: 29.9 g/dL — ABNORMAL LOW (ref 30.0–36.0)
MCV: 86 fL (ref 80.0–100.0)
Platelets: 238 10*3/uL (ref 150–400)
RBC: 3.35 MIL/uL — ABNORMAL LOW (ref 3.87–5.11)
RDW: 15.7 % — ABNORMAL HIGH (ref 11.5–15.5)
WBC: 10.7 10*3/uL — ABNORMAL HIGH (ref 4.0–10.5)
nRBC: 0 % (ref 0.0–0.2)

## 2019-03-03 LAB — GLUCOSE, CAPILLARY
Glucose-Capillary: 144 mg/dL — ABNORMAL HIGH (ref 70–99)
Glucose-Capillary: 119 mg/dL — ABNORMAL HIGH (ref 70–99)
Glucose-Capillary: 151 mg/dL — ABNORMAL HIGH (ref 70–99)
Glucose-Capillary: 158 mg/dL — ABNORMAL HIGH (ref 70–99)

## 2019-03-03 MED ORDER — METHOCARBAMOL 500 MG PO TABS: 500.0000 mg | ORAL_TABLET | Freq: Three times a day (TID) | ORAL | 0 refills | Status: DC

## 2019-03-03 MED ORDER — METHOCARBAMOL 500 MG PO TABS: 500.0000 mg | ORAL_TABLET | Freq: Three times a day (TID) | ORAL | 0 refills | Status: AC

## 2019-03-03 MED ORDER — FERROUS SULFATE 325 (65 FE) MG PO TABS: ORAL_TABLET | ORAL | 0 refills | Status: AC

## 2019-03-03 MED ORDER — ONDANSETRON HCL 4 MG PO TABS: 4.0000 mg | ORAL_TABLET | Freq: Four times a day (QID) | ORAL | 0 refills | Status: AC | PRN

## 2019-03-03 MED ORDER — INSULIN GLARGINE 100 UNIT/ML ~~LOC~~ SOLN: 50.0000 [IU] | Freq: Every day | SUBCUTANEOUS | 11 refills | Status: AC

## 2019-03-03 MED ORDER — ENOXAPARIN SODIUM 40 MG/0.4ML ~~LOC~~ SOLN: 40.0000 mg | SUBCUTANEOUS | 0 refills | Status: DC

## 2019-03-03 MED ORDER — ATORVASTATIN CALCIUM 20 MG PO TABS: 20.0000 mg | ORAL_TABLET | Freq: Every day | ORAL | 1 refills | Status: AC

## 2019-03-03 MED ORDER — HYDROCODONE-ACETAMINOPHEN 5-325 MG PO TABS: 1.0000 | ORAL_TABLET | ORAL | 0 refills | Status: DC | PRN

## 2019-03-03 MED ORDER — SENNOSIDES-DOCUSATE SODIUM 8.6-50 MG PO TABS: 1.0000 | ORAL_TABLET | Freq: Every day | ORAL | 1 refills | Status: AC

## 2019-03-03 MED ORDER — ENOXAPARIN SODIUM 40 MG/0.4ML ~~LOC~~ SOLN: 40.0000 mg | SUBCUTANEOUS | 0 refills | Status: AC

## 2019-03-03 MED ORDER — INSULIN ASPART 100 UNIT/ML ~~LOC~~ SOLN: SUBCUTANEOUS | 3 refills | Status: AC

## 2019-03-03 MED ORDER — CHLORHEXIDINE GLUCONATE CLOTH 2 % EX PADS: 6.0000 | MEDICATED_PAD | Freq: Every day | CUTANEOUS | Status: DC

## 2019-03-03 MED ORDER — OMEPRAZOLE 20 MG PO CPDR: 20.0000 mg | DELAYED_RELEASE_CAPSULE | Freq: Every day | ORAL | 1 refills | Status: AC

## 2019-03-03 MED ORDER — SENNOSIDES-DOCUSATE SODIUM 8.6-50 MG PO TABS: 1.0000 | ORAL_TABLET | Freq: Every day | ORAL | 1 refills | Status: DC

## 2019-03-03 MED ORDER — FERROUS SULFATE 325 (65 FE) MG PO TABS: ORAL_TABLET | ORAL | 0 refills | Status: DC

## 2019-03-03 MED ORDER — LISINOPRIL 2.5 MG PO TABS: 2.5000 mg | ORAL_TABLET | Freq: Every day | ORAL | 1 refills | Status: AC

## 2019-03-03 MED ORDER — PIOGLITAZONE HCL 30 MG PO TABS: 30.0000 mg | ORAL_TABLET | Freq: Every day | ORAL | 1 refills | Status: AC

## 2019-03-03 MED ORDER — NAMZARIC 28-10 MG PO CP24: 1.0000 | ORAL_CAPSULE | Freq: Every day | ORAL | 1 refills | Status: AC

## 2019-03-03 MED ORDER — ACETAMINOPHEN 325 MG PO TABS: 650.0000 mg | ORAL_TABLET | Freq: Four times a day (QID) | ORAL | 0 refills | Status: AC | PRN

## 2019-03-03 MED ORDER — METFORMIN HCL 500 MG PO TABS: 1000.0000 mg | ORAL_TABLET | Freq: Two times a day (BID) | ORAL | 1 refills | Status: AC

## 2019-03-03 MED ORDER — ONDANSETRON HCL 4 MG PO TABS: 4.0000 mg | ORAL_TABLET | Freq: Four times a day (QID) | ORAL | 0 refills | Status: DC | PRN

## 2019-03-03 MED ORDER — DONEPEZIL HCL 10 MG PO TABS: 10.0000 mg | ORAL_TABLET | Freq: Every day | ORAL | 1 refills | Status: AC

## 2019-03-03 NOTE — Progress Notes (Signed)
Patient ID: JYLA HOPF, female   DOB: 04-11-1933, 85 y.o.   MRN: [id number] Postop day 1 status post right tibial nailing  BP (!) 155/60 (BP Location: Left Arm)   Pulse 88   Temp 99.3 F (37.4 C) (Oral)   Resp 17   Ht 5\' 1"  (1.549 m)   Wt 61.2 kg   SpO2 95%   BMI 25.49 kg/m   CBC Latest Ref Rng & Units 02/24/2019 03/02/2019 03/01/2019  WBC 4.0 - 10.5 K/uL 10.7(H) 7.7 8.1  Hemoglobin 12.0 - 15.0 g/dL 8.6(L) 10.0(L) 10.1(L)  Hematocrit 36.0 - 46.0 % 28.8(L) 33.3(L) 33.5(L)  Platelets 150 - 400 K/uL 238 240 254    Compartments are soft neurovascular exam is intact patient remains confused  Start physical therapy weightbearing status as tolerated  Start discharge planning

## 2019-03-03 NOTE — Evaluation (Signed)
Physical Therapy Evaluation Patient Details Name: Cynthia Hanson MRN: [id number] DOB: 1932/07/09 Today's Date: March 12, 2019   History of Present Illness  Cynthia Hanson  is a 85 y.o. female, s/p INTRAMEDULLARY (IM) NAIL TIBIAL (Right) on 03/02/19 w hypertension, hyperlipidemia, dementia, apparently had mechanical fall at home.  Pt tripped over her feet, and fell.  Pt has right lower ext pain. Pt denies syncope, cp, palp, sob, n/v, abd pain, diarrhea, brbpr.    Clinical Impression  Patient limited for functional mobility as stated below secondary to RLE pain/weakness fatigue and poor standing balance.  Patient requires Mod assist to move RLE during supine to sitting and limited to a few slow unsteady side steps with limited weightbearing and at high risk for falls.  Patient tolerated sitting up in chair after therapy.  Patient will benefit from continued physical therapy in hospital and recommended venue below to increase strength, balance, endurance for safe ADLs and gait.     Follow Up Recommendations SNF    Equipment Recommendations  None recommended by PT    Recommendations for Other Services       Precautions / Restrictions Precautions Precautions: Fall Restrictions Weight Bearing Restrictions: Yes RLE Weight Bearing: Weight bearing as tolerated      Mobility  Bed Mobility Overal bed mobility: Needs Assistance Bed Mobility: Supine to Sit     Supine to sit: Mod assist     General bed mobility comments: slow labored movement  Transfers Overall transfer level: Needs assistance Equipment used: Rolling walker (2 wheeled) Transfers: Sit to/from [employer] Sit to Stand: Mod assist Stand pivot transfers: Mod assist;Max assist       General transfer comment: limited weightbearing on RLE due to pain/weakness  Ambulation/Gait Ambulation/Gait assistance: Max assist Gait Distance (Feet): 3 Feet Assistive device: Rolling walker (2 wheeled) Gait  Pattern/deviations: Decreased step length - right;Decreased stance time - right;Decreased step length - left;Decreased stride length Gait velocity: slow   General Gait Details: limited to 3-4 slow unsteady side steps with limited weighbearing on RLE due to increased pain  Stairs            Wheelchair Mobility    Modified Rankin (Stroke Patients Only)       Balance Overall balance assessment: Needs assistance Sitting-balance support: Feet supported;No upper extremity supported Sitting balance-Leahy Scale: Fair Sitting balance - Comments: seated at bedside   Standing balance support: During functional activity;Bilateral upper extremity supported Standing balance-Leahy Scale: Poor Standing balance comment: using RW                             Pertinent Vitals/Pain Pain Assessment: Faces Faces Pain Scale: Hurts even more Pain Location: RLE with movement, weightbearing Pain Descriptors / Indicators: Grimacing;Sore Pain Intervention(s): Limited activity within patient's tolerance;Monitored during session    Home Living Family/patient expects to be discharged to:: Private residence Living Arrangements: Other relatives Available Help at Discharge: Family;Available PRN/intermittently Type of Home: House Home Access: Level entry     Home Layout: One level Home Equipment: Walker - 4 wheels      Prior Function Level of Independence: Independent with assistive device(s)         Comments: household ambulator with Rollator     Hand Dominance        Extremity/Trunk Assessment   Upper Extremity Assessment Upper Extremity Assessment: Generalized weakness    Lower Extremity Assessment Lower Extremity Assessment: Generalized weakness;RLE deficits/detail RLE Deficits / Details: grossly -  3/5 RLE: Unable to fully assess due to pain RLE Sensation: WNL RLE Coordination: WNL    Cervical / Trunk Assessment Cervical / Trunk Assessment: Kyphotic   Communication   Communication: No difficulties  Cognition Arousal/Alertness: Awake/alert Behavior During Therapy: WFL for tasks assessed/performed Overall Cognitive Status: Within Functional Limits for tasks assessed                                        General Comments      Exercises     Assessment/Plan    PT Assessment Patient needs continued PT services  PT Problem List Decreased strength;Decreased activity tolerance;Decreased balance;Decreased mobility       PT Treatment Interventions Gait training;Functional mobility training;Therapeutic activities;Therapeutic exercise;Patient/family education    PT Goals (Current goals can be found in the Care Plan section)  Acute Rehab PT Goals Patient Stated Goal: return home after rehab PT Goal Formulation: With patient Time For Goal Achievement: 03/17/19 Potential to Achieve Goals: Good    Frequency Min 3X/week   Barriers to discharge        Co-evaluation               AM-PAC PT "6 Clicks" Mobility  Outcome Measure Help needed turning from your back to your side while in a flat bed without using bedrails?: A Lot Help needed moving from lying on your back to sitting on the side of a flat bed without using bedrails?: A Lot Help needed moving to and from a bed to a chair (including a wheelchair)?: A Lot Help needed standing up from a chair using your arms (e.g., wheelchair or bedside chair)?: A Lot Help needed to walk in hospital room?: A Lot Help needed climbing 3-5 steps with a railing? : Total 6 Click Score: 11    End of Session   Activity Tolerance: Patient tolerated treatment well;Patient limited by fatigue;Patient limited by pain Patient left: in chair;with call bell/phone within reach Nurse Communication: Mobility status PT Visit Diagnosis: Unsteadiness on feet (R26.81);Other abnormalities of gait and mobility (R26.89);Muscle weakness (generalized) (M62.81)    Time: 0109-3235 PT Time  Calculation (min) (ACUTE ONLY): 24 min   Charges:   PT Evaluation $PT Eval Moderate Complexity: 1 Mod PT Treatments $Therapeutic Activity: 23-37 mins        1:22 PM, March 28, 2019 Lonell Grandchild, MPT Physical Therapist with [facility] [phone] office [phone] mobile phone

## 2019-03-03 NOTE — Plan of Care (Signed)
  Problem: Acute Rehab PT Goals(only PT should resolve) Goal: Pt Will Go Supine/Side To Sit Outcome: Progressing Flowsheets (Taken 02/26/2019 1324) Pt will go Supine/Side to Sit: with minimal assist Goal: Patient Will Transfer Sit To/From Stand Outcome: Progressing Flowsheets (Taken 02/08/2019 1324) Patient will transfer sit to/from stand:  with minimal assist  with moderate assist Goal: Pt Will Transfer Bed To Chair/Chair To Bed Outcome: Progressing Flowsheets (Taken 02/23/2019 1324) Pt will Transfer Bed to Chair/Chair to Bed:  with min assist  with mod assist Goal: Pt Will Ambulate Outcome: Progressing Flowsheets (Taken 02/25/2019 1324) Pt will Ambulate:  15 feet  with moderate assist  with rolling walker   1:24 PM, 02/19/2019 Lonell Grandchild, MPT Physical Therapist with [facility] [phone] office [phone] mobile phone

## 2019-03-03 NOTE — Discharge Summary (Signed)
Cynthia Hanson, is a 85 y.o. female  DOB 08-25-1932  MRN [id number].  Admission date:  02/28/2019  Admitting Physician  Pearson Grippe, MD  Discharge Date:  March 30, 2019   Primary MD  Medicine, [facility] Internal  Recommendations for primary care physician for things to follow:   1) sliding scale with NovoLog insulin  -insulin aspart (novoLOG) injection 0-9 Units 0-9 Units, Subcutaneous,Ac and Hs Correction coverage: Sensitive (thin, NPO, renal) CBG < 70: Implement Hypoglycemia Standing Orders and refer to Hypoglycemia Standing Orders sidebar report CBG 70 - 120: 0 units CBG 121 - 150: 1 unit CBG 151 - 200: 2 units CBG 201 - 250: 3 units CBG 251 - 300: 5 units CBG 301 - 350: 7 units CBG 351 - 400: 9 units CBG > 400: call MD  2)Repeat CBC on Friday, 03/06/2019  3)Follow-up with orthopedic surgeon Dr. Romeo Apple as advised  Admission Diagnosis  Fall, initial encounter [W19.XXXA]  Discharge Diagnosis  Fall, initial encounter [W19.XXXA]    Principal Problem:   Tibial fracture Active Problems:   Dementia (HCC)   Essential hypertension   Hyperlipidemia   Diabetes (HCC)      Past Medical History:  Diagnosis Date   Dementia (HCC)    Diabetes mellitus without complication (HCC)    High cholesterol    Hypertension     Past Surgical History:  Procedure Laterality Date   CHOLECYSTECTOMY       HPI  from the history and physical done on the day of admission:    -  Cynthia Hanson  is a 85 y.o. female, w hypertension, hyperlipidemia, dementia, apparently had mechanical fall at home.  Pt tripped over her feet, and fell.  Pt has right lower ext pain. Pt denies syncope, cp, palp, sob, n/v, abd pain, diarrhea, brbpr.   In Ed,  T 98.4, P 77 R 21, Bp 161/63  Pox 100% on RA  Wt 54.4kg  Xray R tib/ fib IMPRESSION: 1. Oblique fracture through the distal tibial diaphysis with 1 bone width displacement  and mild override. 2. Twp oblique fractures through the diaphysis of the fibula.  Xray hip IMPRESSION: No pelvic fracture or hip fracture. No dislocation  covid negative  Wbc 12.1, hgb 11.2, Plt 297 Na 140, K 4.2, Bun 22, Creatinine 0.92 Ast 22, Alt 12  Pt will be admitted for fall and R tib/ fib fracture    Hospital Course:     1)Right closed tibial shaft fracture-status post fall on 02/28/2019, status post ORIF (post right tibial nailing) on 03/02/2019--other management per orthopedic team -Lovenox subcu for DVT prophylaxis as ordered -WBAT with physical therapy  2)Dementia Stable, at baseline patient with significant cognitive deficits, cont Aricept  po qday, Cont Namenda XR  po qday  3)Hypertension Cont Lisinopril 2.5mg  po qday  4) acute blood loss anemia--- due to peri-and postoperative blood loss,  iron tablets as prescribed, repeat H&H on Friday, 03/06/2019  Hyperlipidemia Cont Lipitor  po qhs  Dm2 -Currently on Lantus 50 units daily,, along with sliding  scale coverage as ordered C/n  Metformin and actos   Disposition --transfer to SNF rehab  Code Status : Full  Family Communication:   Discussed with Niece [place]) at bedside,   Disposition Plan  : SNF    Consults  :  ortho  DVT Prophylaxis  :  Lovenox -    Discharge Condition: stable  Follow UP  Contact information for after-discharge care    Destination    [facility] Preferred SNF .   Service: Skilled Nursing Contact information: [address] [place] [address] [phone]              Diet and Activity recommendation:  As advised  Discharge Instructions    Discharge Instructions    Call MD for:  difficulty breathing, headache or visual disturbances   Complete by: As directed    Call MD for:  persistant dizziness or light-headedness   Complete by: As directed    Call MD for:  persistant nausea and vomiting   Complete  by: As directed    Call MD for:  severe uncontrolled pain   Complete by: As directed    Call MD for:  temperature >100.4   Complete by: As directed    Diet - low sodium heart healthy   Complete by: As directed    Discharge instructions   Complete by: As directed    1) sliding scale with NovoLog insulin  -insulin aspart (novoLOG) injection 0-9 Units 0-9 Units, Subcutaneous,Ac and Hs Correction coverage: Sensitive (thin, NPO, renal) CBG < 70: Implement Hypoglycemia Standing Orders and refer to Hypoglycemia Standing Orders sidebar report CBG 70 - 120: 0 units CBG 121 - 150: 1 unit CBG 151 - 200: 2 units CBG 201 - 250: 3 units CBG 251 - 300: 5 units CBG 301 - 350: 7 units CBG 351 - 400: 9 units CBG > 400: call MD  2)Repeat CBC on Friday, 03/06/2019  3) follow-up with orthopedic surgeon Dr. Romeo Apple as advised   Increase activity slowly   Complete by: As directed    Activity restrictions as advised by orthopedic surgeon        Discharge Medications     Allergies as of 2019-03-13   No Known Allergies     Medication List    TAKE these medications   acetaminophen 325 MG tablet Commonly known as: TYLENOL Take 2 tablets (650 mg total) by mouth every 6 (six) hours as needed for mild pain, fever or headache (or Fever >/= 101).   atorvastatin 20 MG tablet Commonly known as: LIPITOR Take 20 mg by mouth daily.   donepezil 10 MG tablet Commonly known as: ARICEPT Take 10 mg by mouth daily.   enoxaparin 40 MG/0.4ML injection Commonly known as: LOVENOX Inject 0.4 mLs (40 mg total) into the skin daily for 21 days.   ferrous sulfate 325 (65 FE) MG tablet 1 tablet twice a day with meals   HYDROcodone-acetaminophen 5-325 MG tablet Commonly known as: NORCO/VICODIN Take 1-2 tablets by mouth every 4 (four) hours as needed for moderate pain or severe pain (1 tablet as needed moderate pain, 2 tablets as needed severe pain).   insulin aspart 100 UNIT/ML injection Commonly known  as: NovoLOG sliding scale with NovoLog insulin 0-9 Units, Subcutaneous,Ac and Hs Correction coverage: Sensitive (thin, NPO, renal) CBG < 70: Implement Hypoglycemia Standing Orders and refer to Hypoglycemia Standing Orders sidebar report CBG 70 - 120: 0 units CBG 121 - 150: 1 unit CBG 151 -  200: 2 units CBG 201 - 250: 3 units CBG 251 - 300: 5 units CBG 301 - 350: 7 units CBG 351 - 400: 9 units CBG > 400: call MD   insulin glargine 100 UNIT/ML injection Commonly known as: LANTUS Inject 50 Units into the skin daily.   lisinopril 2.5 MG tablet Commonly known as: ZESTRIL Take 2.5 mg by mouth daily.   metFORMIN 500 MG tablet Commonly known as: GLUCOPHAGE Take 1,000 mg by mouth 2 (two) times daily with a meal.   methocarbamol 500 MG tablet Commonly known as: Robaxin Take 1 tablet (500 mg total) by mouth 3 (three) times daily for 10 days.   Namzaric 28-10 MG Cp24 Generic drug: Memantine HCl-Donepezil HCl Take 1 capsule by mouth daily.   omeprazole 20 MG capsule Commonly known as: PRILOSEC Take 20 mg by mouth daily.   ondansetron 4 MG tablet Commonly known as: ZOFRAN Take 1 tablet (4 mg total) by mouth every 6 (six) hours as needed for nausea.   pioglitazone 30 MG tablet Commonly known as: ACTOS Take 30 mg by mouth daily.   senna-docusate 8.6-50 MG tablet Commonly known as: Senokot-S Take 1 tablet by mouth at bedtime.       Major procedures and Radiology Reports - PLEASE review detailed and final reports for all details, in brief -   Dg Tibia/fibula Right  Result Date: 03/02/2019 CLINICAL DATA:  Tibial fractures post fixation. EXAM: DG C-ARM 1-60 MIN; RIGHT TIBIA AND FIBULA - 2 VIEW CONTRAST:  None. FLUOROSCOPY TIME:  Fluoroscopy Time:  4 minutes 9 seconds. Radiation Exposure Index (if provided by the fluoroscopic device): 18.82 mGy Number of Acquired Spot Images: 12 COMPARISON:  02/28/2019 FINDINGS: Fixation of patient's distal diaphyseal fracture with intramedullary nail and  associated screws. Hardware is intact as there is near anatomic alignment over the fracture site. Evidence of patient's known proximal and distal fibular fracture. Recommend correlation with findings at the time of the procedure. IMPRESSION: Placement of intramedullary nail bridging patient's distal tibial diaphyseal fracture with hardware intact and near anatomic alignment over the fracture site. Evidence of patient's known proximal and distal fibular fractures. Electronically Signed   By: Elberta Fortisaniel  Boyle M.D.   On: 03/02/2019 15:49   Dg Tibia/fibula Right  Result Date: 02/28/2019 CLINICAL DATA:  PT brought in by [facility] from home with family today. EMS reports pt was viewed turning around while walking and her right lower leg felt a pop and she fell to the ground. Obvious fracture noted to right lower leg weak EXAM: RIGHT TIBIA AND FIBULA - 2 VIEW COMPARISON:  None. FINDINGS: Oblique fractures through the distal tibial diaphysis with 1 bone with posterior displacement of the distal fracture fragment seen on cross-table projection. Mild override. Oblique fracture through the distal fibula just above the metaphysis. Additional fracture of the proximal fibula at the upper third of the diaphysis with several mm of displacement ( 4 mm). IMPRESSION: 1. Oblique fracture through the distal tibial diaphysis with 1 bone width displacement and mild override. 2. Twp oblique fractures through the diaphysis of the fibula. Electronically Signed   By: Genevive BiStewart  Edmunds M.D.   On: 02/28/2019 17:44   Dg Pelvis Portable  Result Date: 02/28/2019 CLINICAL DATA:  PT brought in by [facility] from home with family today. EMS reports pt was viewed turning around while walking and her right lower leg felt a pop and she fell to the ground. Obvious fracture noted to right lower leg weak EXAM: PORTABLE PELVIS 1-2 VIEWS  COMPARISON:  None. FINDINGS: Hips are located. No pelvic fracture. No femoral neck fracture on AP view IMPRESSION: No pelvic  fracture or hip fracture.  No dislocation Electronically Signed   By: Suzy Bouchard M.D.   On: 02/28/2019 17:40   Dg Chest Port 1 View  Result Date: 02/28/2019 CLINICAL DATA:  PT brought in by [facility] from home with family today. EMS reports pt was viewed turning around while walking and her right lower leg felt a pop and she fell to the ground. Obvious fracture noted to right lower legdata EXAM: PORTABLE CHEST 1 VIEW COMPARISON:  Radiograph 07/19/2018 FINDINGS: Normal mediastinum and cardiac silhouette. Normal pulmonary vasculature. No evidence of effusion, infiltrate, or pneumothorax. No acute bony abnormality. IMPRESSION: No acute cardiopulmonary process. Electronically Signed   By: Suzy Bouchard M.D.   On: 02/28/2019 17:39   Dg C-arm 1-60 Min  Result Date: 03/02/2019 CLINICAL DATA:  Tibial fractures post fixation. EXAM: DG C-ARM 1-60 MIN; RIGHT TIBIA AND FIBULA - 2 VIEW CONTRAST:  None. FLUOROSCOPY TIME:  Fluoroscopy Time:  4 minutes 9 seconds. Radiation Exposure Index (if provided by the fluoroscopic device): 18.82 mGy Number of Acquired Spot Images: 12 COMPARISON:  02/28/2019 FINDINGS: Fixation of patient's distal diaphyseal fracture with intramedullary nail and associated screws. Hardware is intact as there is near anatomic alignment over the fracture site. Evidence of patient's known proximal and distal fibular fracture. Recommend correlation with findings at the time of the procedure. IMPRESSION: Placement of intramedullary nail bridging patient's distal tibial diaphyseal fracture with hardware intact and near anatomic alignment over the fracture site. Evidence of patient's known proximal and distal fibular fractures. Electronically Signed   By: Marin Olp M.D.   On: 03/02/2019 15:49    Micro Results    Recent Results (from the past 240 hour(s))  SARS Coronavirus 2 [facility] order, Performed in [facility] hospital lab) Nasopharyngeal Nasopharyngeal Swab     Status: None   Collection  Time: 02/28/19  7:16 PM   Specimen: Nasopharyngeal Swab  Result Value Ref Range Status   SARS Coronavirus 2 NEGATIVE NEGATIVE Final    Comment: (NOTE) If result is NEGATIVE SARS-CoV-2 target nucleic acids are NOT DETECTED. The SARS-CoV-2 RNA is generally detectable in upper and lower  respiratory specimens during the acute phase of infection. The lowest  concentration of SARS-CoV-2 viral copies this assay can detect is 250  copies / mL. A negative result does not preclude SARS-CoV-2 infection  and should not be used as the sole basis for treatment or other  patient management decisions.  A negative result may occur with  improper specimen collection / handling, submission of specimen other  than nasopharyngeal swab, presence of viral mutation(s) within the  areas targeted by this assay, and inadequate number of viral copies  (<250 copies / mL). A negative result must be combined with clinical  observations, patient history, and epidemiological information. If result is POSITIVE SARS-CoV-2 target nucleic acids are DETECTED. The SARS-CoV-2 RNA is generally detectable in upper and lower  respiratory specimens dur ing the acute phase of infection.  Positive  results are indicative of active infection with SARS-CoV-2.  Clinical  correlation with patient history and other diagnostic information is  necessary to determine patient infection status.  Positive results do  not rule out bacterial infection or co-infection with other viruses. If result is PRESUMPTIVE POSTIVE SARS-CoV-2 nucleic acids MAY BE PRESENT.   A presumptive positive result was obtained on the submitted specimen  and confirmed on  repeat testing.  While 2019 novel coronavirus  (SARS-CoV-2) nucleic acids may be present in the submitted sample  additional confirmatory testing may be necessary for epidemiological  and / or clinical management purposes  to differentiate between  SARS-CoV-2 and other Sarbecovirus currently known  to infect humans.  If clinically indicated additional testing with an alternate test  methodology [phone]) is advised. The SARS-CoV-2 RNA is generally  detectable in upper and lower respiratory sp ecimens during the acute  phase of infection. The expected result is Negative. Fact Sheet for Patients:  BoilerBrush.com.cy Fact Sheet for Healthcare Providers: https://pope.com/ This test is not yet approved or cleared by the [place] FDA and has been authorized for detection and/or diagnosis of SARS-CoV-2 by FDA under an Emergency Use Authorization (EUA).  This EUA will remain in effect (meaning this test can be used) for the duration of the COVID-19 declaration under Section 564(b)(1) of the Act, 21 U.S.C. section 360bbb-3(b)(1), unless the authorization is terminated or revoked sooner. Performed at [facility], [address], [place], [place] [address]   Surgical pcr screen     Status: None   Collection Time: 03/01/19 12:26 AM   Specimen: Nasal Mucosa; Nasal Swab  Result Value Ref Range Status   MRSA, PCR NEGATIVE NEGATIVE Final   Staphylococcus aureus NEGATIVE NEGATIVE Final    Comment: (NOTE) The Xpert SA Assay (FDA approved for NASAL specimens in patients 17 years of age and older), is one component of a comprehensive surveillance program. It is not intended to diagnose infection nor to guide or monitor treatment. Performed at [facility], [address], [place], [place] [address]        Today   Subjective    Heide Brossart today has no new complaints  No chest pains, no fevers no productive cough          Patient has been seen and examined prior to discharge   Objective   Blood pressure 116/69, pulse 83, temperature 98.9 F (37.2 C), temperature source Oral, resp. rate 18, height  (1.549 m), weight 61.2 kg, SpO2 100 %.   Intake/Output Summary (Last 24 hours) at 03-29-19 1518 Last data filed at 03-29-2019  1300 Gross per 24 hour  Intake 1363 ml  Output 1075 ml  Net 288 ml    Exam Gen:- Awake Alert, no acute distress  HEENT:- [place].AT, No sclera icterus Neck-Supple Neck,No JVD,.  Lungs-  CTAB , good air movement bilaterally  CV- S1, S2 normal, regular Abd-  +ve B.Sounds, Abd Soft, No tenderness,    Extremity/Skin:- No  edema,   good pulses Psych-baseline memory and cognitive deficits persist Neuro-generalized weakness no new focal deficits, no tremors  MSK-right lower extremity with postop splint dressing   Data Review   CBC w Diff:  Lab Results  Component Value Date   WBC 10.7 (H) Mar 29, 2019   HGB 8.6 (L) 29-Mar-2019   HCT 28.8 (L) 2019/03/29   PLT 238 03/29/19   LYMPHOPCT 6 02/28/2019   MONOPCT 8 02/28/2019   EOSPCT 0 02/28/2019   BASOPCT 1 02/28/2019    CMP:  Lab Results  Component Value Date   NA 141 03/02/2019   K 4.2 03/02/2019   CL 105 03/02/2019   CO2 27 03/02/2019   BUN 18 03/02/2019   CREATININE 0.73 03/02/2019   PROT 5.8 (L) 03/01/2019   ALBUMIN 3.2 (L) 03/01/2019   BILITOT 0.7 03/01/2019   ALKPHOS 79 03/01/2019   AST 18 03/01/2019   ALT 12  03/01/2019  .   Total Discharge time is about 33 minutes  Shon Hale M.D on 2019/03/14 at 3:18 PM  Go to [email or web address] -  for contact info  [facility] - Office  [phone]

## 2019-03-03 NOTE — Progress Notes (Signed)
Patient left floor in stable condition via w/c accompanied by [facility] staff. Discharged to [facility]. Donavan Foil, RN

## 2019-03-03 NOTE — Care Management (Signed)
[facility]  [address]  [place], [place] [address] [phone]        Add [facility]  to My Favorites- Opens in a new window    5 out of 5 starsfootnote   Much Above Average 5 out of 5 starsfootnote   Much Above Average 3 out of 5 starsfootnote   Average 3 out of 5 starsfootnote   Average 2.9  [employer]iles    Map marker for [facility]  [facility] [place]  [address]  [place], [place] [address] [phone]        Add [facility] [place]  to My Favorites- Opens in a new window    2 out of 5 starsfootnote   Below Average 2 out of 5 starsfootnote   Below Average 2 out of 5 starsfootnote   Below Average 2 out of 5 starsfootnote   Below Average 3.0  [employer]Miles    Map marker for [facility]  [facility]  [address]  [place], [place] [address] [phone]        Add [facility]  to My Favorites- Lauris PoagOpens in a new window    4 out of 5 starsfootnote   Above Average 4 out of 5 starsfootnote   Above Average 2 out of 5 starsfootnote   Below Average 4 out of 5 starsfootnote   Above Average 10.7  [employer]Miles    Map marker for [facility]  [facility]  [address]  [place], [place] [address] [phone]        Add [facility]  to My Favorites- Opens in a new window    3 out of 5 starsfootnote   Average 3 out of 5 starsfootnote   Average 2 out of 5 starsfootnote   Below Average 3 out of 5 starsfootnote   Average 12.5  [employer]Miles    Map marker for [facility]  [facility]  [address] [place]  [place], [place] [address] [phone]        Add [facility]  to My Favorites- Opens in a new window    1 out of 5 starsfootnote   Much Below  Average 2 out of 5 starsfootnote   Below Average 1 out of 5 starsfootnote   Much Below Average 3 out of 5 starsfootnote   Average 14.7  [employer]Miles    Map marker for [facility]  [facility]  [address]  [place], [place] [address] [phone]        Add [facility]  to My Favorites- Opens in a new window    4 out of 5 starsfootnote   Above Average 3 out of 5 starsfootnote   Average 2 out of 5 starsfootnote   Below Average 5 out of 5 starsfootnote   Much Above Average 22.5  [employer]Miles    Map marker for [facility]  [facility]  [address]  [place], [place] [address] [phone]        Add [facility]  to My Favorites- Opens in a new window    1 out of 5 starsfootnote   Much Below Average 1 out of 5 starsfootnote   Much  Below Average 2 out of 5 starsfootnote   Below Average 2 out of 5 starsfootnote   Below Average 24.2  [employer] marker for [place], [place] [address] (336) [place] [facility]  to My Favorites- Opens in a new window    3 out of 5 starsfootnote   Average 3 out of 5 starsfootnote   Average 2 out of 5 starsfootnote   Below Average 2 out of 5 starsfootnote   Below Average 24.4  Miles    Map marker for [place]  [place]  [place], [place] [address] [phone]        [place]  to My Favorites- Opens in a new window    1 out of 5 starsfootnote   Much Below Average 1 out of 5 starsfootnote   Much Below Average 2 out of 5 starsfootnote   Below Average 3 out of 5 starsfootnote   Average 25.5  [employer] marker for [place]   [place]  [place], [place] [address] [phone]        [place]  to My Favorites- Opens in a new window    2 out of 5 starsfootnote   Below Average 2 out of 5 starsfootnote   Below Average 2 out of 5 starsfootnote   Below Average 3 out of 5 starsfootnote   Average 25.9  [employer] marker for [place]  [address] [place], [place] [address] [phone]        [place]  to My Favorites- Opens in a new window    5 out of 5 starsfootnote   Much Above Average 5 out of 5 starsfootnote   Much Above Average 3 out of 5 starsfootnote   Average 2 out of 5 starsfootnote   Below Average 26.1  [employer] marker for [facility]  [address] [place] [address] [phone]        [place]  to My [place] in a new window    3 out of 5 starsfootnote   Average 3 out of 5 starsfootnote   Average 2 out of 5 starsfootnote   Below Average 4 out of 5 starsfootnote   Above Average 26.7  [employer] marker for [place]  [address] [place] [address] [phone]        [place]  to My [place] in a new window    3 out of 5 starsfootnote   Average 3 out of 5 starsfootnote   Average 3 out of 5 starsfootnote   Average 4 out of 5 starsfootnote   Above Average 27.0  [employer] marker for [place]  [address] [place], [place] [address] [phone]  Add [facility]  to My Favorites- Opens in a new window    2 out of 5 starsfootnote   Below Average 2 out of 5 starsfootnote    Below Average 2 out of 5 starsfootnote   Below Average 2 out of 5 starsfootnote   Below Average 27.0  [employer] marker for [facility]  [facility]  [address]  [place], [place] [address] [phone]        Add [facility]  to My Favorites- Opens in a new window    1 out of 5 starsfootnote   Much Below Average 1 out of 5 starsfootnote   Much Below Average 2 out of 5 starsfootnote   Below Average 1 out of 5 starsfootnote   Much Below Average 27.1  [employer] marker for [facility]  [facility]  [address]  [place], [place] [address] [phone]        Add [facility]  to My Favorites- Opens in a new window    3 out of 5 starsfootnote   Average 3 out of 5 starsfootnote   Average 2 out of 5 starsfootnote   Below Average 4 out of 5 starsfootnote   Above Average 27.8  [employer] marker for [facility]  [address]  [place], [place] [address] [phone]        Add [facility]  to My Favorites- Opens in a new window    5 out of 5 starsfootnote   Much Above Average 5 out of 5 starsfootnote   Much Above Average 5 out of 5 starsfootnote   Much Above Average 5 out of 5 starsfootnote   Much Above Average 28.0  [employer] marker for [facility]  [facility]  [address]  [place], [place] [address] [phone]        Add [facility]  to My Favorites- Opens in a new window    5 out of 5 starsfootnote   Much Above Average 4 out of 5 starsfootnote   Above Average 5 out of 5 starsfootnote   Much Above Average 5 out of 5 starsfootnote   Much Above Average 28.1  [employer] marker for [facility]  [address]   [place], [place] [address] [phone]        Add [facility]  to My Favorites- Opens in a new window    5 out of 5 starsfootnote   Much Above Average 4 out of 5 starsfootnote   Above Average 5 out of 5 starsfootnote   Much Above Average 5 out of 5 starsfootnote   Much Above Average 28.2  [employer] marker for [facility]  [address]  [place], [place] [address] [phone]        Add [facility]  to My Favorites- Opens in a new window    5 out of 5 starsfootnote   Much Above Average 4 out of 5 starsfootnote   Above Average 5 out of 5 starsfootnote   Much Above  Average 5 out of 5 starsfootnote   Much Above Average 28.3  [employer] 1 - 20 of 66 results

## 2019-03-03 NOTE — TOC Transition Note (Signed)
Transition of Care [facility]) - CM/SW Discharge Note   Patient Details  Name: GENESI STEFANKO MRN: [id number] Date of Birth: 09-19-1932  [facility]) CM/SW Contact:  Teola Felipe, Chauncey Reading, RN Phone Number: 03-30-2019, 2:41 PM   Clinical Narrative:   Patient [facility] Family Lattie Haw, Joycelyn Schmid and [place]) all in agreement. Marianna Fuss of [facility] to contact Garfield Cornea, niece.  Will send DC clinicals when finished. [facility] staff will transport patient. Bedside RN to call report.      Barriers to Discharge: SNF Pending bed offer   Patient Goals and CMS Choice Patient states their goals for this hospitalization and ongoing recovery are:: family really wants patient to return home CMS Medicare.gov Compare Post Acute Care list provided to:: Patient Choice offered to / list presented to : Sibling(and niece)     Discharge Plan and Services   Discharge Planning Services: CM Consult                  Social Determinants of Health (SDOH) Interventions     Readmission Risk Interventions No flowsheet data found.

## 2019-03-03 NOTE — TOC Initial Note (Signed)
Transition of Care [facility]) - Initial/Assessment Note    Patient Details  Name: Cynthia Hanson MRN: [id number] Date of Birth: 11/26/1932  [facility]) CM/SW Contact:    Lashawn Bromwell, Chauncey Reading, RN Phone Number: 03/04/2019, 11:39 AM  Clinical Narrative: Postop day 1 status post right tibial nailing. Recommended for SNF. Patient having intermittent confusion and HOH. Call to sister, Marnette Burgess, with whom patient lives. Prior to mechanical fall, patient independently walked and lives at home with [facility] and family checks in frequently. Discussed SNF recommendation with Bessie and niece Joycelyn Schmid. Family really wants to take patient home with home health PT.   Discussed that family may not be able to provide the level of assistance that patient needs at this time.  Marnette Burgess is 34 years old and niece reports she can not do heavy lifting.   They are agreeable to a referral to [facility].              Expected Discharge Plan: Skilled Nursing Facility Barriers to Discharge: SNF Pending bed offer   Patient Goals and CMS Choice Patient states their goals for this hospitalization and ongoing recovery are:: family really wants patient to return home CMS Medicare.gov Compare Post Acute Care list provided to:: Patient Choice offered to / list presented to : Sibling(and niece)  Expected Discharge Plan and Services Expected Discharge Plan: [place]   Discharge Planning Services: CM Consult   Living arrangements for the past 2 months: Single Family Home                       Prior Living Arrangements/Services Living arrangements for the past 2 months: Single Family Home Lives with:: Siblings Patient language and need for interpreter reviewed:: Yes Do you feel safe going back to the place where you live?: Yes      Need for Family Participation in Patient Care: Yes (Comment) Care giver support system in place?: Yes (comment)   Criminal Activity/Legal Involvement  Pertinent to Current Situation/Hospitalization: No - Comment as needed  Activities of Daily Living Home Assistive Devices/Equipment: Walker (specify type) ADL Screening (condition at time of admission) Patient's cognitive ability adequate to safely complete daily activities?: No Is the patient deaf or have difficulty hearing?: No Does the patient have difficulty seeing, even when wearing glasses/contacts?: No Does the patient have difficulty concentrating, remembering, or making decisions?: Yes Patient able to express need for assistance with ADLs?: No Does the patient have difficulty dressing or bathing?: No Independently performs ADLs?: Yes (appropriate for developmental age) Does the patient have difficulty walking or climbing stairs?: No Weakness of Legs: None Weakness of Arms/Hands: None  Permission Sought/Granted   Permission granted to share information with : Yes, Verbal Permission Granted     Permission granted to share info w AGENCY: [facility]        Emotional Assessment       Orientation: : Oriented to Place, Oriented to Self Alcohol / Substance Use: Not Applicable Psych Involvement: No (comment)  Admission diagnosis:  Fall, initial encounter [W19.XXXA] Patient Active Problem List   Diagnosis Date Noted  . Tibial fracture 02/28/2019  . Dementia ([place]) 02/28/2019  . Essential hypertension 02/28/2019  . Hyperlipidemia 02/28/2019  . Diabetes ([place]) 02/28/2019   PCP:  Medicine, [facility] Internal Pharmacy:   [facility] [place] [place] [address] Phone:  [phone] Fax: [phone]     Social Determinants of Health (SDOH) Interventions    Readmission Risk Interventions No flowsheet data found.

## 2019-03-03 NOTE — Discharge Instructions (Signed)
1) sliding scale with NovoLog insulin  -insulin aspart (novoLOG) injection 0-9 Units 0-9 Units, Subcutaneous,Ac and Hs Correction coverage: Sensitive (thin, NPO, renal) CBG < 70: Implement Hypoglycemia Standing Orders and refer to Hypoglycemia Standing Orders sidebar report CBG 70 - 120: 0 units CBG 121 - 150: 1 unit CBG 151 - 200: 2 units CBG 201 - 250: 3 units CBG 251 - 300: 5 units CBG 301 - 350: 7 units CBG 351 - 400: 9 units CBG > 400: call MD  2)Repeat CBC on Friday, 03/06/2019  3) follow-up with orthopedic surgeon Dr. Aline Brochure as advised

## 2019-03-03 NOTE — Progress Notes (Signed)
Discharging to [facility] today. Called report to receiving nurse at [facility]. IV site removed, site within normal limits. Discharge packet with AVS and prescriptions to be sent with patient at discharge. [place] staff to come and pick up patient this afternoon. Pt in stable condition awaiting discharge. Donavan Foil, RN

## 2019-03-04 ENCOUNTER — Non-Acute Institutional Stay (SKILLED_NURSING_FACILITY): Payer: Medicare Other | Admitting: Adult Health

## 2019-03-04 ENCOUNTER — Encounter: Payer: Self-pay | Admitting: Adult Health

## 2019-03-04 ENCOUNTER — Encounter (HOSPITAL_COMMUNITY): Payer: Self-pay | Admitting: Orthopedic Surgery

## 2019-03-04 DIAGNOSIS — K219 Gastro-esophageal reflux disease without esophagitis: Secondary | ICD-10-CM

## 2019-03-04 DIAGNOSIS — K5909 Other constipation: Secondary | ICD-10-CM | POA: Diagnosis not present

## 2019-03-04 DIAGNOSIS — E1159 Type 2 diabetes mellitus with other circulatory complications: Secondary | ICD-10-CM

## 2019-03-04 DIAGNOSIS — S82301S Unspecified fracture of lower end of right tibia, sequela: Secondary | ICD-10-CM

## 2019-03-04 DIAGNOSIS — E1169 Type 2 diabetes mellitus with other specified complication: Secondary | ICD-10-CM

## 2019-03-04 DIAGNOSIS — F0150 Vascular dementia without behavioral disturbance: Secondary | ICD-10-CM | POA: Diagnosis not present

## 2019-03-04 DIAGNOSIS — S82831S Other fracture of upper and lower end of right fibula, sequela: Secondary | ICD-10-CM | POA: Diagnosis not present

## 2019-03-04 DIAGNOSIS — D62 Acute posthemorrhagic anemia: Secondary | ICD-10-CM

## 2019-03-04 DIAGNOSIS — E119 Type 2 diabetes mellitus without complications: Secondary | ICD-10-CM | POA: Diagnosis not present

## 2019-03-04 DIAGNOSIS — Z794 Long term (current) use of insulin: Secondary | ICD-10-CM | POA: Diagnosis not present

## 2019-03-04 DIAGNOSIS — I10 Essential (primary) hypertension: Secondary | ICD-10-CM

## 2019-03-04 DIAGNOSIS — E785 Hyperlipidemia, unspecified: Secondary | ICD-10-CM

## 2019-03-04 NOTE — Progress Notes (Signed)
Location:    [place] Room Number: 160/P Place of Service:  SNF (31)   CODE STATUS: Full Code  No Known Allergies  Chief Complaint  Patient presents with  . Hospitalization Follow-up     HPI:    Past Medical History:  Diagnosis Date  . Closed fracture of right tibia and fibula   . Dementia ([place])   . Diabetes mellitus without complication ([place])   . Hyperlipidemia   . Hypertension     Social History   Socioeconomic History  . Marital status: Not on file    Spouse name: Not on file  . Number of children: Not on file  . Years of education: Not on file  . Highest education level: Not on file  Occupational History  . Not on file  Social Needs  . Financial resource strain: Not on file  . Food insecurity    Worry: Not on file    Inability: Not on file  . Transportation needs    Medical: Not on file    Non-medical: Not on file  Tobacco Use  . Smoking status: Unknown If Ever Smoked  Substance and Sexual Activity  . Alcohol use: Not on file  . Drug use: Not on file  . Sexual activity: Not on file  Lifestyle  . Physical activity    Days per week: Not on file    Minutes per session: Not on file  . Stress: Not on file  Relationships  . Social Herbalist on phone: Not on file    Gets together: Not on file    Attends religious service: Not on file    Active member of club or organization: Not on file    Attends meetings of clubs or organizations: Not on file    Relationship status: Not on file  . Intimate partner violence    Fear of current or ex partner: Not on file    Emotionally abused: Not on file    Physically abused: Not on file    Forced sexual activity: Not on file  Other Topics Concern  . Not on file  Social History Narrative  . Not on file   No family history on file.    VITAL SIGNS BP (!) 105/58   Pulse 80   Temp (!) 97.2 F (36.2 C) (Oral)   Resp 20   Wt 118 lb 12.8 oz (53.9 kg)   Outpatient  Encounter Medications as of 03/04/2019  Medication Sig  . atorvastatin (LIPITOR) 20 MG tablet Take 20 mg by mouth daily.  Mar[place] Kitchen donepezil (ARICEPT) 10 MG tablet Take 10 mg by mouth daily.  Marland Kitchen enoxaparin (LOVENOX) 40 MG/0.4ML injection Inject 40 mg into the skin daily.  . ferrous sulfate (KP FERROUS SULFATE) 325 (65 FE) MG tablet Take 325 mg by mouth 2 (two) times daily with a meal.  . insulin aspart (NOVOLOG FLEXPEN) 100 UNIT/ML FlexPen Inject 3 Units into the skin 3 (three) times daily with meals. Give only if accu-check is greater than 200. With Meals  . Insulin Glargine (LANTUS SOLOSTAR) 100 UNIT/ML Solostar Pen Inject 50 Units into the skin daily.  Marland Kitchen lisinopril (ZESTRIL) 2.5 MG tablet Take 2.5 mg by mouth daily.  . Memantine HCl-Donepezil HCl (NAMZARIC) 28-10 MG CP24 Take by mouth. capsule,sprinkle,ER 24hr; 28-10 mg; oral  Special Instructions: Do not crush. Once A Day 10:00 AM  . metFORMIN (GLUCOPHAGE) 1000 MG tablet Take 1,000 mg by mouth 2 (two) times daily  with a meal.  . methocarbamol (ROBAXIN) 500 MG tablet Take 500 mg by mouth 3 (three) times daily.  . NON FORMULARY Diet: Regular, NAS, Consistent Carbohydrate  . omeprazole (PRILOSEC) 20 MG capsule Take 20 mg by mouth daily.  . ondansetron (ZOFRAN) 4 MG tablet Take 4 mg by mouth every 6 (six) hours as needed for nausea or vomiting.  . pioglitazone (ACTOS) 30 MG tablet Take 30 mg by mouth daily. Give with meals  . sennosides-docusate sodium (SENOKOT-S) 8.6-50 MG tablet Take 1 tablet by mouth at bedtime.  . [DISCONTINUED] acetaminophen (TYLENOL) 325 MG tablet Take 650 mg by mouth every 6 (six) hours as needed.  . [DISCONTINUED] HYDROcodone-acetaminophen (NORCO/VICODIN) 5-325 MG tablet Take 1 tablet by mouth every 4 (four) hours as needed for moderate pain.   No facility-administered encounter medications on file as of 03/04/2019.      SIGNIFICANT DIAGNOSTIC EXAMS       ASSESSMENT/ PLAN:    MD is aware of resident's narcotic  use and is in agreement with current plan of care. We will attempt to wean resident as appropriate.  Synthia Innocent NP [facility]  Contact [phone] Monday through Friday 8am- 5pm  After hours call [phone]   This encounter was created in error - please disregard.

## 2019-03-05 ENCOUNTER — Encounter: Payer: Self-pay | Admitting: Adult Health

## 2019-03-05 ENCOUNTER — Non-Acute Institutional Stay (SKILLED_NURSING_FACILITY): Payer: Medicare Other | Admitting: Adult Health

## 2019-03-05 ENCOUNTER — Other Ambulatory Visit: Payer: Self-pay | Admitting: Adult Health

## 2019-03-05 DIAGNOSIS — S82831S Other fracture of upper and lower end of right fibula, sequela: Secondary | ICD-10-CM

## 2019-03-05 DIAGNOSIS — D62 Acute posthemorrhagic anemia: Secondary | ICD-10-CM

## 2019-03-05 DIAGNOSIS — S82301S Unspecified fracture of lower end of right tibia, sequela: Secondary | ICD-10-CM

## 2019-03-05 DIAGNOSIS — F0150 Vascular dementia without behavioral disturbance: Secondary | ICD-10-CM

## 2019-03-05 MED ORDER — OXYCODONE HCL 5 MG PO TABS: 5.0000 mg | ORAL_TABLET | Freq: Four times a day (QID) | ORAL | 0 refills | Status: AC | PRN

## 2019-03-05 NOTE — Progress Notes (Deleted)
Location:    [place] Room Number: 160/P Place of Service:  SNF (31)   CODE STATUS: Full Code  No Known Allergies  Chief Complaint  Patient presents with  . Acute Visit    [place], Blood Sugar 110 mg/dl    HPI:    No past medical history on file.  *** The histories are not reviewed yet. Please review them in the "History" navigator section and refresh this [place].  Social History   Socioeconomic History  . Marital status: Not on file    Spouse name: Not on file  . Number of children: Not on file  . Years of education: Not on file  . Highest education level: Not on file  Occupational History  . Not on file  Social Needs  . Financial resource strain: Not on file  . Food insecurity    Worry: Not on file    Inability: Not on file  . Transportation needs    Medical: Not on file    Non-medical: Not on file  Tobacco Use  . Smoking status: Unknown If Ever Smoked  Substance and Sexual Activity  . Alcohol use: Not on file  . Drug use: Not on file  . Sexual activity: Not on file  Lifestyle  . Physical activity    Days per week: Not on file    Minutes per session: Not on file  . Stress: Not on file  Relationships  . Social Herbalist on phone: Not on file    Gets together: Not on file    Attends religious service: Not on file    Active member of club or organization: Not on file    Attends meetings of clubs or organizations: Not on file    Relationship status: Not on file  . Intimate partner violence    Fear of current or ex partner: Not on file    Emotionally abused: Not on file    Physically abused: Not on file    Forced sexual activity: Not on file  Other Topics Concern  . Not on file  Social History Narrative  . Not on file   No family history on file.    VITAL SIGNS BP 119/65   Pulse 73   Temp 97.7 F (36.5 C) (Oral)   Resp 18   Ht 4\' 9"  (1.448 m)   Wt 113 lb 14.4 oz (51.7 kg)   BMI  24.65 kg/m   Outpatient Encounter Medications as of 03/05/2019  Medication Sig  . acetaminophen (TYLENOL) 500 MG tablet Take 500 mg by mouth 3 (three) times daily.  Marland Kitchen atorvastatin (LIPITOR) 20 MG tablet Take 20 mg by mouth daily.  Mar[place] Kitchen donepezil (ARICEPT) 10 MG tablet Take 10 mg by mouth daily.  Mar[place] Kitchen enoxaparin (LOVENOX) 40 MG/0.4ML injection Inject 40 mg into the skin daily.  . ferrous sulfate (KP FERROUS SULFATE) 325 (65 FE) MG tablet Take 325 mg by mouth 2 (two) times daily with a meal.  . insulin aspart (NOVOLOG FLEXPEN) 100 UNIT/ML FlexPen Inject 3 Units into the skin 3 (three) times daily with meals. Give only if accu-check is greater than 200. With Meals  . Insulin Glargine (LANTUS SOLOSTAR) 100 UNIT/ML Solostar Pen Inject 50 Units into the skin daily.  Mar[place] Kitchen lisinopril (ZESTRIL) 2.5 MG tablet Take 2.5 mg by mouth daily.  . Memantine HCl-Donepezil HCl (NAMZARIC) 28-10 MG CP24 Take by mouth. capsule,sprinkle,ER 24hr; 28-10 mg; oral  Special Instructions:  Do not crush. Once A Day 10:00 AM  . metFORMIN (GLUCOPHAGE) 1000 MG tablet Take 1,000 mg by mouth 2 (two) times daily with a meal.  . methocarbamol (ROBAXIN) 500 MG tablet Take 500 mg by mouth 3 (three) times daily.  . NON FORMULARY Diet: Regular, NAS, Consistent Carbohydrate  . omeprazole (PRILOSEC) 20 MG capsule Take 20 mg by mouth daily.  . ondansetron (ZOFRAN) 4 MG tablet Take 4 mg by mouth every 6 (six) hours as needed for nausea or vomiting.  Marland Kitchen oxyCODONE (OXY IR/ROXICODONE) 5 MG immediate release tablet Take 5 mg by mouth every 6 (six) hours as needed for severe pain.  . pioglitazone (ACTOS) 30 MG tablet Take 30 mg by mouth daily. Give with meals  . sennosides-docusate sodium (SENOKOT-S) 8.6-50 MG tablet Take 1 tablet by mouth at bedtime.  . [DISCONTINUED] acetaminophen (TYLENOL) 325 MG tablet Take 650 mg by mouth every 6 (six) hours as needed.  . [DISCONTINUED] HYDROcodone-acetaminophen (NORCO/VICODIN) 5-325 MG tablet Take 1 tablet by  mouth every 4 (four) hours as needed for moderate pain.   No facility-administered encounter medications on file as of 03/05/2019.      SIGNIFICANT DIAGNOSTIC EXAMS       ASSESSMENT/ PLAN:    MD is aware of resident's narcotic use and is in agreement with current plan of care. We will attempt to wean resident as appropriate.  Synthia Innocent NP [facility]  Contact [phone] Monday through Friday 8am- 5pm  After hours call [phone]

## 2019-03-05 DEATH — deceased

## 2019-03-06 NOTE — NC FL2 (Cosign Needed)
  [place] LEVEL OF CARE SCREENING TOOL     IDENTIFICATION  Patient Name: Cynthia Hanson Birthdate: 1933-02-11 Sex: female Admission Date (Current Location): 2019/03/16  [place] and [place] Number:  [place] [facility] [id number] [place] and Address:  [facility] [address] [place]      Provider Number: [id number]  Attending Physician Name and Address:  Hennie Duos, MD  Relative Name and Phone Number:  Bessie-sister [phone]    Current Level of Care: Hospital Recommended Level of Care: [place] Prior Approval Number:    Date Approved/Denied: 2019-03-16 PASRR Number: [id number]  Discharge Plan: SNF    Current Diagnoses: Patient Active Problem List   Diagnosis Date Noted  . Tibial fracture 02/28/2019  . Dementia ([place]) 02/28/2019  . Essential hypertension 02/28/2019  . Hyperlipidemia 02/28/2019  . Diabetes ([place]) 02/28/2019    Orientation RESPIRATION BLADDER Height & Weight     Self, Time, Situation  Normal Incontinent, External catheter Weight:   Height:     BEHAVIORAL SYMPTOMS/MOOD NEUROLOGICAL BOWEL NUTRITION STATUS      Continent Diet  AMBULATORY STATUS COMMUNICATION OF NEEDS Skin   Extensive Assist Verbally Other (Comment)(Incision right leg)                       Personal Care Assistance Level of Assistance  Bathing, Feeding, Dressing Bathing Assistance: Limited assistance Feeding assistance: Limited assistance Dressing Assistance: Limited assistance     Functional Limitations Info  Sight, Hearing, Speech Sight Info: Adequate Hearing Info: Impaired(HOH) Speech Info: Adequate    SPECIAL CARE FACTORS FREQUENCY  PT (By licensed PT)     PT Frequency: 5x/week              Contractures Contractures Info: Not present    Additional Factors Info  Code Status, Allergies Code Status Info: FULL CODE Allergies Info: NKA Psychotropic Info: Aricept         Current Medications  (03/06/2019):  This is the current hospital active medication list No current facility-administered medications for this encounter.      Discharge Medications: Please see discharge summary for a list of discharge medications.  Relevant Imaging Results:  Relevant Lab Results:   Additional Information SSN [id number]  Ariadna Setter, Chauncey Reading, RN

## 2019-03-09 ENCOUNTER — Encounter: Payer: Self-pay | Admitting: Adult Health

## 2019-03-09 DIAGNOSIS — E119 Type 2 diabetes mellitus without complications: Secondary | ICD-10-CM | POA: Insufficient documentation

## 2019-03-09 DIAGNOSIS — E1169 Type 2 diabetes mellitus with other specified complication: Secondary | ICD-10-CM | POA: Insufficient documentation

## 2019-03-09 DIAGNOSIS — D62 Acute posthemorrhagic anemia: Secondary | ICD-10-CM | POA: Insufficient documentation

## 2019-03-09 DIAGNOSIS — K5909 Other constipation: Secondary | ICD-10-CM | POA: Insufficient documentation

## 2019-03-09 DIAGNOSIS — K219 Gastro-esophageal reflux disease without esophagitis: Secondary | ICD-10-CM | POA: Insufficient documentation

## 2019-03-09 DIAGNOSIS — S82101S Unspecified fracture of upper end of right tibia, sequela: Secondary | ICD-10-CM | POA: Insufficient documentation

## 2019-03-09 DIAGNOSIS — F0150 Vascular dementia without behavioral disturbance: Secondary | ICD-10-CM | POA: Insufficient documentation

## 2019-03-09 NOTE — Progress Notes (Signed)
This encounter was created in error - please disregard. This encounter was created in error - please disregard. This encounter was created in error - please disregard. 

## 2019-03-09 NOTE — Progress Notes (Signed)
This encounter was created in error - please disregard.

## 2019-03-09 NOTE — Progress Notes (Addendum)
Location:   [facility] Room Number: 160 Place of Service:  SNF (31)   CODE STATUS: full code  No Known Allergies  Chief Complaint  Patient presents with  . Hospitalization Follow-up    HPI:  She is a 85 year old woman who was hospitalized from 02-28-19 through April 01, 2019. She suffered a fall and presented to the ED was found found to have a right distal end tibial fracture had a nail done on 03-02-19. She is here for short term rehab with her goal to return back home. She does have pain in her right leg. She would benefit from consistent medication. There are no reports of anxiety or agitation. She will continue to be followed for her chronic illnesses including: dementia; hypertension; anemia.   Past Medical History:  Diagnosis Date  . Dementia (HCC)   . Diabetes mellitus without complication (HCC)   . High cholesterol   . Hypertension     Past Surgical History:  Procedure Laterality Date  . CHOLECYSTECTOMY    . TIBIA IM NAIL INSERTION Right 03/02/2019   Procedure: INTRAMEDULLARY (IM) NAIL TIBIAL;  Surgeon: Vickki Hearing, MD;  Location: [facility];  Service: Orthopedics;  Laterality: Right;    Social History   Socioeconomic History  . Marital status: Unknown    Spouse name: Not on file  . Number of children: Not on file  . Years of education: Not on file  . Highest education level: Not on file  Occupational History  . Not on file  Social Needs  . Financial resource strain: Not on file  . Food insecurity    Worry: Not on file    Inability: Not on file  . Transportation needs    Medical: Not on file    Non-medical: Not on file  Tobacco Use  . Smoking status: Never Smoker  . Smokeless tobacco: Never Used  Substance and Sexual Activity  . Alcohol use: Never    Frequency: Never  . Drug use: Never  . Sexual activity: Never  Lifestyle  . Physical activity    Days per week: Not on file    Minutes per session: Not on file  . Stress: Not on file   Relationships  . Social Musician on phone: Not on file    Gets together: Not on file    Attends religious service: Not on file    Active member of club or organization: Not on file    Attends meetings of clubs or organizations: Not on file    Relationship status: Not on file  . Intimate partner violence    Fear of current or ex partner: Not on file    Emotionally abused: Not on file    Physically abused: Not on file    Forced sexual activity: Not on file  Other Topics Concern  . Not on file  Social History Narrative  . Not on file   Family History  Problem Relation Age of Onset  . CAD Father       VITAL SIGNS BP (!) 116/58   Pulse 78   Temp 97.7 F (36.5 C)   Resp 18   Ht 4\' 9"  (1.448 m)   Wt 118 lb (53.5 kg)   BMI 25.53 kg/m   Outpatient Encounter Medications as of 03/04/2019  Medication Sig  . acetaminophen (TYLENOL) 325 MG tablet Take 2 tablets (650 mg total) by mouth every 6 (six) hours as needed for mild pain, fever or  headache (or Fever >/= 101).  Marland Kitchen atorvastatin (LIPITOR) 20 MG tablet Take 1 tablet (20 mg total) by mouth daily.  Marland Kitchen donepezil (ARICEPT) 10 MG tablet Take 1 tablet (10 mg total) by mouth daily.  Marland Kitchen enoxaparin (LOVENOX) 40 MG/0.4ML injection Inject 0.4 mLs (40 mg total) into the skin daily for 21 days.  . ferrous sulfate 325 (65 FE) MG tablet 1 tablet twice a day with meals  . insulin aspart (NOVOLOG) 100 UNIT/ML injection sliding scale with NovoLog insulin 0-9 Units, Subcutaneous,Ac and Hs Correction coverage: Sensitive (thin, NPO, renal) CBG < 70: Implement Hypoglycemia Standing Orders and refer to Hypoglycemia Standing Orders sidebar report CBG 70 - 120: 0 units CBG 121 - 150: 1 unit CBG 151 - 200: 2 units CBG 201 - 250: 3 units CBG 251 - 300: 5 units CBG 301 - 350: 7 units CBG 351 - 400: 9 units CBG > 400: call MD  . insulin glargine (LANTUS) 100 UNIT/ML injection Inject 0.5 mLs (50 Units total) into the skin daily.  Marland Kitchen lisinopril (ZESTRIL)  2.5 MG tablet Take 1 tablet (2.5 mg total) by mouth daily.  . Memantine HCl-Donepezil HCl (NAMZARIC) 28-10 MG CP24 Take 1 capsule by mouth daily.  . metFORMIN (GLUCOPHAGE) 500 MG tablet Take 2 tablets (1,000 mg total) by mouth 2 (two) times daily with a meal.  . methocarbamol (ROBAXIN) 500 MG tablet Take 1 tablet (500 mg total) by mouth 3 (three) times daily for 10 days.  Marland Kitchen omeprazole (PRILOSEC) 20 MG capsule Take 1 capsule (20 mg total) by mouth daily.  . ondansetron (ZOFRAN) 4 MG tablet Take 1 tablet (4 mg total) by mouth every 6 (six) hours as needed for nausea.  . pioglitazone (ACTOS) 30 MG tablet Take 1 tablet (30 mg total) by mouth daily.  Marland Kitchen senna-docusate (SENOKOT-S) 8.6-50 MG tablet Take 1 tablet by mouth at bedtime.  . [DISCONTINUED] HYDROcodone-acetaminophen (NORCO/VICODIN) 5-325 MG tablet Take 1-2 tablets by mouth every 4 (four) hours as needed for moderate pain or severe pain (1 tablet as needed moderate pain, 2 tablets as needed severe pain).   No facility-administered encounter medications on file as of 03/04/2019.      SIGNIFICANT DIAGNOSTIC EXAMS  TODAY;   02-28-19: chest x-ray: No acute cardiopulmonary process   02-28-19: pelvic x-ray; Hips are located. No pelvic fracture. No femoral neck fracture on [facility] view  02-28-19: right tibia fibula x-ray;  1. Oblique fracture through the distal tibial diaphysis with 1 bone width displacement and mild override. 2. Twp oblique fractures through the diaphysis of the fibula  LABS REVIEWED:   02-28-19: wbc 12.1; hgb 11.2; hct 36.7; mcv 85.9 plt 297; glucose 154; bun 22; creat 0.92; k+ 4.2; na++ 140; ca 9.6; liver normal albumin 3.6 Mar 22, 2019: bc 10.7; hgb 8.6; hct 28.8; mcv 86.0; plt 238  Review of Systems  Constitutional: Negative for malaise/fatigue.  Respiratory: Negative for cough.   Cardiovascular: Negative for chest pain.  Gastrointestinal: Negative for abdominal pain.  Musculoskeletal: Negative for joint pain.       Right leg  pain   Skin: Negative.   Neurological: Negative for dizziness.  Psychiatric/Behavioral: The patient is not nervous/anxious.     Physical Exam Constitutional:      General: She is not in acute distress.    Appearance: She is well-developed. She is not diaphoretic.  Neck:     Musculoskeletal: Neck supple.     Thyroid: No thyromegaly.  Cardiovascular:     Rate and Rhythm: Normal rate  and regular rhythm.     Pulses: Normal pulses.     Heart sounds: Normal heart sounds.  Pulmonary:     Effort: Pulmonary effort is normal. No respiratory distress.     Breath sounds: Normal breath sounds.  Abdominal:     General: Bowel sounds are normal. There is no distension.     Palpations: Abdomen is soft.     Tenderness: There is no abdominal tenderness.  Musculoskeletal:     Right lower leg: No edema.     Left lower leg: No edema.     Comments: Is able to move all extremities Right lower extremity in ace splint  Lymphadenopathy:     Cervical: No cervical adenopathy.  Skin:    General: Skin is warm and dry.  Neurological:     Mental Status: She is alert. Mental status is at baseline.  Psychiatric:        Mood and Affect: Mood normal.     ASSESSMENT/ PLAN:  TODAY;   1. Hypertension associated with type 2 diabetes mellitus: is stable will continue lisinopril 2.5 mg daily   2. Insulin dependent type 2 diabetes mellitus controlled: is stable will continue actos 30 mg daily metformin 1 gm twice daily lantus 50 units; novolog SSI  3. Dyslipidemia associated with type 2 diabetes mellitus: is stable will continue lipitor 20 mg daily   4. Acute blood loss anemia: stable hgb 8.6 will continue iron daily   5. Chronic constipation: is stable will continue senna s daily   6. GERD without esophagitis: is stable will continue prilosec 20 mg daily '  7. Fracture of distal end of tibia with fibula sequela: is stable will continue therapy as directed and will follow up with orthopedics; will  continue lovenox 40 mg daily will change to tylenol 1 gm three times daily and will use oxycodone 5 mg every 6 hours as needed through 03-11-19   8. Vascular dementia without behavioral disturbance: is stable will continue aricept 10 mg daily and namzaric 28/10 mg daily   MD is aware of resident's narcotic use and is in agreement with current plan of care. We will attempt to wean resident as appropriate.  Synthia Innocenteborah Naser Schuld NP [facility]  Contact [phone] Monday through Friday 8am- 5pm  After hours call [phone]

## 2019-03-09 NOTE — Progress Notes (Signed)
Location[facility] Room Number: 160 Place of Service:  SNF (31)   CODE STATUS: full code   No Known Allergies  Chief Complaint  Patient presents with  . Acute Visit    72 hour care plan meeting     HPI:  We have come together for her 42 hour care plan meeting. Her goal is to return back home with her sister. She has a walker; wheelchair and glucometer at home. She has no steps; lives on one floor. She denies any pain at this time. She denies any changes in her appetite. There are no reports of insomnia.   Past Medical History:  Diagnosis Date  . Dementia ([place])   . Diabetes mellitus without complication ([place])   . High cholesterol   . Hypertension     Past Surgical History:  Procedure Laterality Date  . CHOLECYSTECTOMY    . TIBIA IM NAIL INSERTION Right 03/02/2019   Procedure: INTRAMEDULLARY (IM) NAIL TIBIAL;  Surgeon: Carole Civil, MD;  Location: [facility];  Service: Orthopedics;  Laterality: Right;    Social History   Socioeconomic History  . Marital status: Unknown    Spouse name: Not on file  . Number of children: Not on file  . Years of education: Not on file  . Highest education level: Not on file  Occupational History  . Not on file  Social Needs  . Financial resource strain: Not on file  . Food insecurity    Worry: Not on file    Inability: Not on file  . Transportation needs    Medical: Not on file    Non-medical: Not on file  Tobacco Use  . Smoking status: Never Smoker  . Smokeless tobacco: Never Used  Substance and Sexual Activity  . Alcohol use: Never    Frequency: Never  . Drug use: Never  . Sexual activity: Never  Lifestyle  . Physical activity    Days per week: Not on file    Minutes per session: Not on file  . Stress: Not on file  Relationships  . Social Herbalist on phone: Not on file    Gets together: Not on file    Attends religious service: Not on file    Active member of club or organization: Not on  file    Attends meetings of clubs or organizations: Not on file    Relationship status: Not on file  . Intimate partner violence    Fear of current or ex partner: Not on file    Emotionally abused: Not on file    Physically abused: Not on file    Forced sexual activity: Not on file  Other Topics Concern  . Not on file  Social History Narrative  . Not on file   Family History  Problem Relation Age of Onset  . CAD Father       VITAL SIGNS BP (!) 128/56   Pulse 79   Temp 98.1 F (36.7 C)   Resp 18   Ht 4\' 9"  (1.448 m)   Wt 118 lb (53.5 kg)   BMI 25.53 kg/m   Outpatient Encounter Medications as of 03/05/2019  Medication Sig  . acetaminophen (TYLENOL) 325 MG tablet Take 2 tablets (650 mg total) by mouth every 6 (six) hours as needed for mild pain, fever or headache (or Fever >/= 101).  Marland Kitchen atorvastatin (LIPITOR) 20 MG tablet Take 1 tablet (20 mg total) by mouth daily.  Marland Kitchen  donepezil (ARICEPT) 10 MG tablet Take 1 tablet (10 mg total) by mouth daily.  Marland Kitchen enoxaparin (LOVENOX) 40 MG/0.4ML injection Inject 0.4 mLs (40 mg total) into the skin daily for 21 days.  . ferrous sulfate 325 (65 FE) MG tablet 1 tablet twice a day with meals  . insulin aspart (NOVOLOG) 100 UNIT/ML injection sliding scale with NovoLog insulin 0-9 Units, Subcutaneous,Ac and Hs Correction coverage: Sensitive (thin, NPO, renal) CBG < 70: Implement Hypoglycemia Standing Orders and refer to Hypoglycemia Standing Orders sidebar report CBG 70 - 120: 0 units CBG 121 - 150: 1 unit CBG 151 - 200: 2 units CBG 201 - 250: 3 units CBG 251 - 300: 5 units CBG 301 - 350: 7 units CBG 351 - 400: 9 units CBG > 400: call MD  . insulin glargine (LANTUS) 100 UNIT/ML injection Inject 0.5 mLs (50 Units total) into the skin daily.  Marland Kitchen lisinopril (ZESTRIL) 2.5 MG tablet Take 1 tablet (2.5 mg total) by mouth daily.  . Memantine HCl-Donepezil HCl (NAMZARIC) 28-10 MG CP24 Take 1 capsule by mouth daily.  . metFORMIN (GLUCOPHAGE) 500 MG tablet Take 2  tablets (1,000 mg total) by mouth 2 (two) times daily with a meal.  . methocarbamol (ROBAXIN) 500 MG tablet Take 1 tablet (500 mg total) by mouth 3 (three) times daily for 10 days.  Marland Kitchen omeprazole (PRILOSEC) 20 MG capsule Take 1 capsule (20 mg total) by mouth daily.  . ondansetron (ZOFRAN) 4 MG tablet Take 1 tablet (4 mg total) by mouth every 6 (six) hours as needed for nausea.  Marland Kitchen oxyCODONE (OXY IR/ROXICODONE) 5 MG immediate release tablet Take 1 tablet (5 mg total) by mouth every 6 (six) hours as needed for up to 5 days for severe pain.  . pioglitazone (ACTOS) 30 MG tablet Take 1 tablet (30 mg total) by mouth daily.  Marland Kitchen senna-docusate (SENOKOT-S) 8.6-50 MG tablet Take 1 tablet by mouth at bedtime.   No facility-administered encounter medications on file as of 03/05/2019.      SIGNIFICANT DIAGNOSTIC EXAMS   PREVIOUS;   02-28-19: chest x-ray: No acute cardiopulmonary process   02-28-19: pelvic x-ray; Hips are located. No pelvic fracture. No femoral neck fracture on [facility] view  02-28-19: right tibia fibula x-ray;  1. Oblique fracture through the distal tibial diaphysis with 1 bone width displacement and mild override. 2. Twp oblique fractures through the diaphysis of the fibula  NO NEW EXAMS.   LABS REVIEWED: PREVIOUS  02-28-19: wbc 12.1; hgb 11.2; hct 36.7; mcv 85.9 plt 297; glucose 154; bun 22; creat 0.92; k+ 4.2; na++ 140; ca 9.6; liver normal albumin 3.6 03/10/19: bc 10.7; hgb 8.6; hct 28.8; mcv 86.0; plt 238  NO NEW LABS.   Review of Systems  Constitutional: Negative for malaise/fatigue.  Respiratory: Negative for cough.   Cardiovascular: Negative for chest pain.  Gastrointestinal: Negative for abdominal pain.  Musculoskeletal: Negative for joint pain.  Skin: Negative.   Neurological: Negative for dizziness.  Psychiatric/Behavioral: The patient is not nervous/anxious.     Physical Exam Constitutional:      General: She is not in acute distress.    Appearance: She is  well-developed. She is not diaphoretic.  Neck:     Musculoskeletal: Neck supple.     Thyroid: No thyromegaly.  Cardiovascular:     Rate and Rhythm: Normal rate and regular rhythm.     Pulses: Normal pulses.     Heart sounds: Normal heart sounds.  Pulmonary:     Effort: Pulmonary  effort is normal. No respiratory distress.     Breath sounds: Normal breath sounds.  Abdominal:     General: Bowel sounds are normal. There is no distension.     Palpations: Abdomen is soft.     Tenderness: There is no abdominal tenderness.  Musculoskeletal:     Right lower leg: No edema.     Left lower leg: No edema.     Comments: Is able to move all extremities Right lower extremity in ace splint   Lymphadenopathy:     Cervical: No cervical adenopathy.  Skin:    General: Skin is warm and dry.  Neurological:     Mental Status: She is alert. Mental status is at baseline.  Psychiatric:        Mood and Affect: Mood normal.         ASSESSMENT/ PLAN:  TODAY;   1. Acute blood anemia 2. Vascular dementia without behavioral disturbance 3. Fracture of distal end of tibia with fibula: sequela  Will continue current medications Will continue therapy as directed Her goal is to return home  Will not need dme.   MD is aware of resident's narcotic use and is in agreement with current plan of care. We will attempt to wean resident as appropriate.  Synthia Innocenteborah Letoya Stallone NP [facility]  Contact [phone] Monday through Friday 8am- 5pm  After hours call [phone]

## 2019-03-10 ENCOUNTER — Other Ambulatory Visit: Payer: Self-pay | Admitting: *Deleted

## 2019-03-10 ENCOUNTER — Encounter (HOSPITAL_COMMUNITY): Payer: Self-pay | Admitting: Orthopedic Surgery

## 2019-03-10 NOTE — Patient Outreach (Signed)
Member assessed for potential [facility] Care Management needs as a benefit of  [place] [employer].  During telephonic IDT meeting with [facility] facility staff, [facility] UM RN, and writer, facility reports Mrs. Bernabe expired on Saturday 03-13-2019 at the facility.   Marthenia Rolling, MSN-Ed, RN,BSN [place] Acute Care Coordinator [phone] [facility]) [phone]  (Toll free office)

## 2019-03-16 ENCOUNTER — Ambulatory Visit: Payer: Medicare Other | Admitting: Orthopedic Surgery

## 2019-04-05 DEATH — deceased

## 2020-03-16 IMAGING — CR DG CHEST 1V PORT
1 series · 1 of 1 positions shown · non-contrast
Comparison: Radiograph 07/19/2018

CLINICAL DATA: PT brought in by [facility] from home with family today.
EMS reports pt was viewed turning around while walking and her right
lower leg felt a pop and she fell to the ground. Obvious fracture
noted to right lower legdata

EXAM:
PORTABLE CHEST 1 VIEW

[portable]
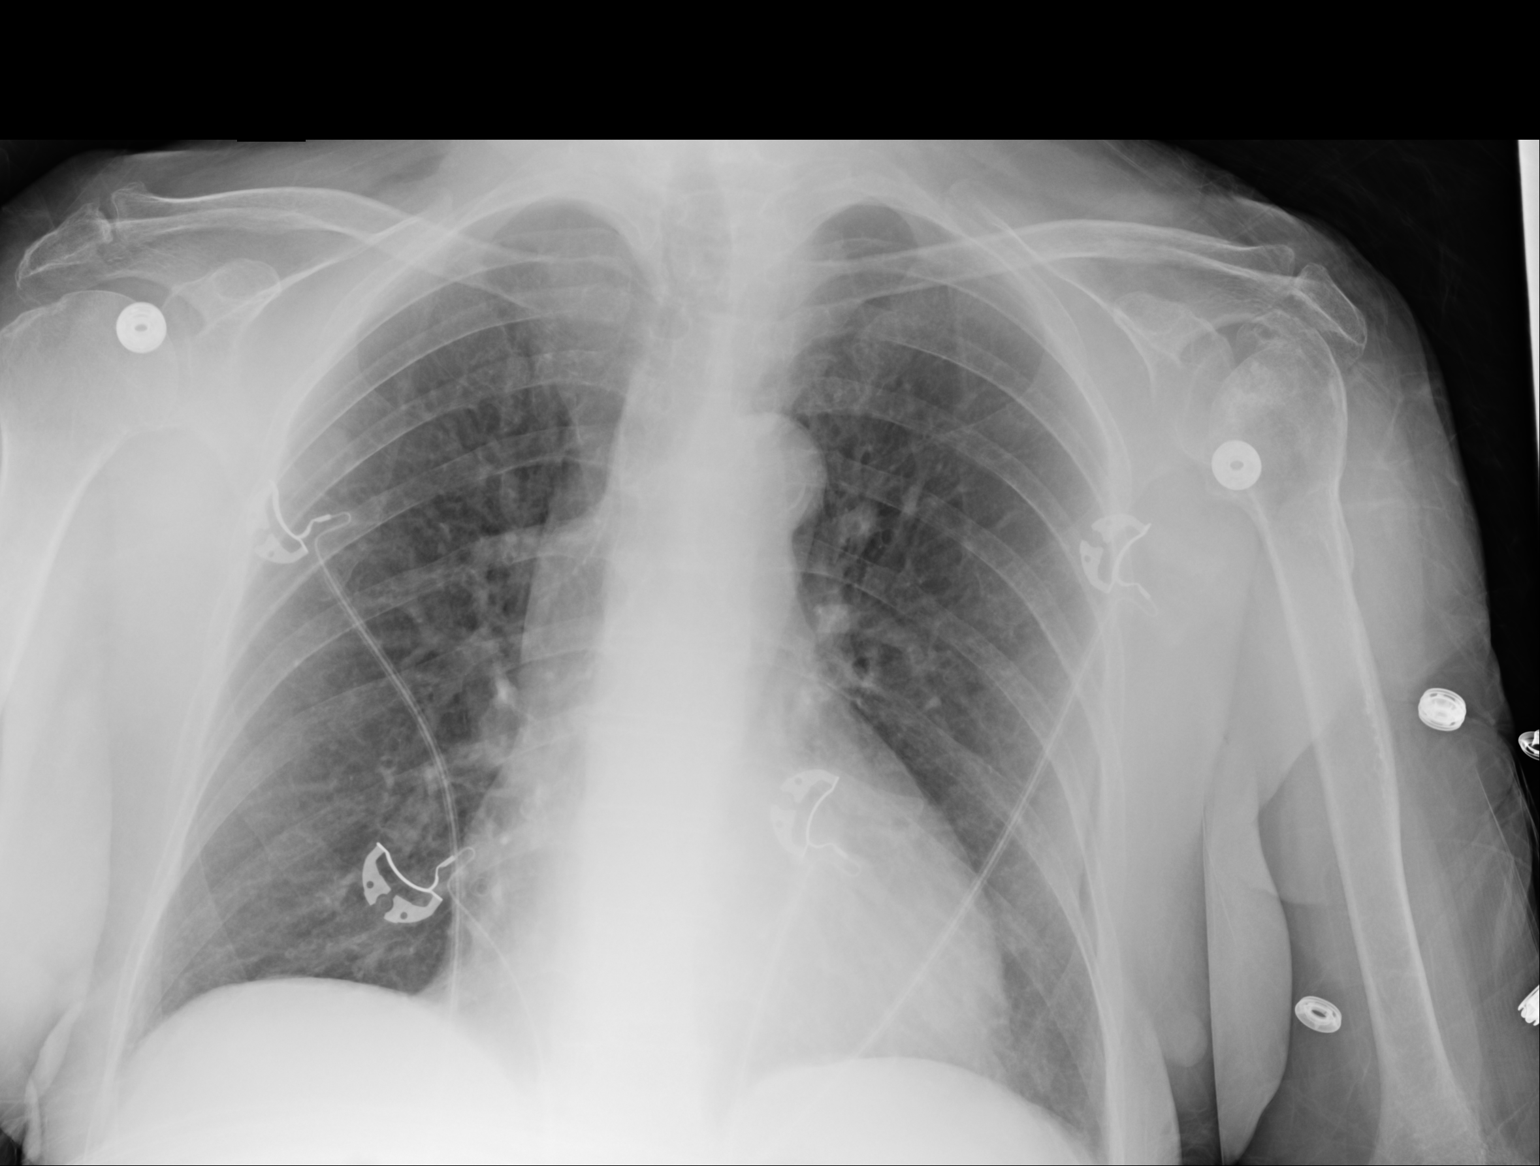

[1 of 1 positions shown; findings below may reference images not displayed]

FINDINGS: Normal mediastinum and cardiac silhouette. Normal pulmonary
vasculature. No evidence of effusion, infiltrate, or pneumothorax.
No acute bony abnormality.
IMPRESSION: No acute cardiopulmonary process.

## 2020-03-18 IMAGING — RF DG TIBIA/FIBULA 2V*R*
1 series · 12 of 12 positions shown · IV contrast (agent unspecified)
Comparison: 02/28/2019

CLINICAL DATA: Tibial fractures post fixation.

EXAM:
DG C-ARM 1-60 MIN; RIGHT TIBIA AND FIBULA - 2 VIEW
CONTRAST:  None.
FLUOROSCOPY TIME:  Fluoroscopy Time:  4 minutes 9 seconds.
Radiation Exposure Index (if provided by the fluoroscopic device):
18.82 mGy
Number of Acquired Spot Images: 12

[Series 1: run · 12 of 12 slices shown]
[im 1/12]
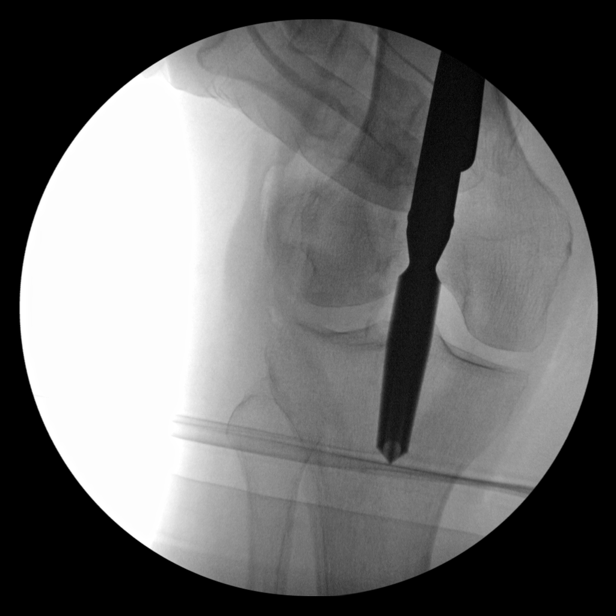
[im 2/12]
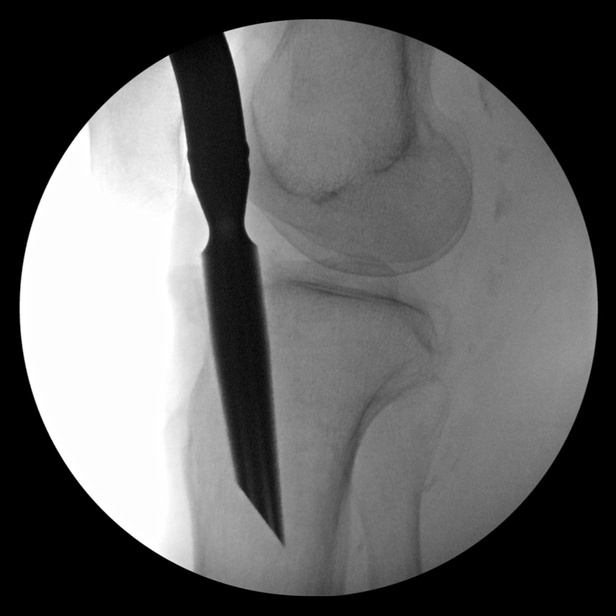
[im 3/12]
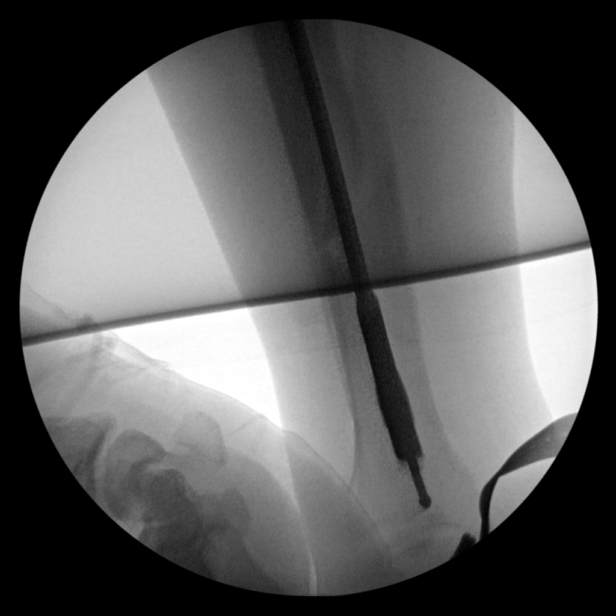
[im 4/12]
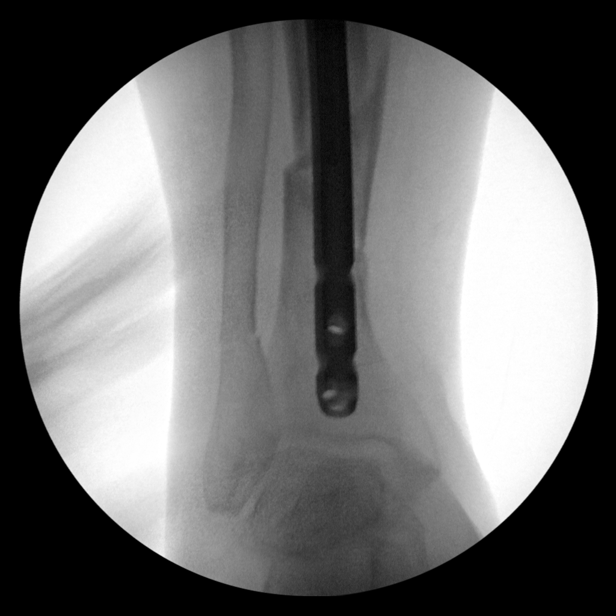
[im 5/12]
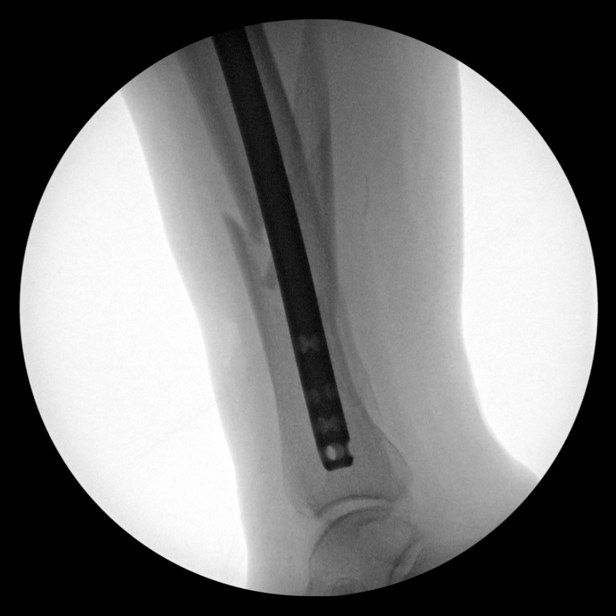
[im 6/12]
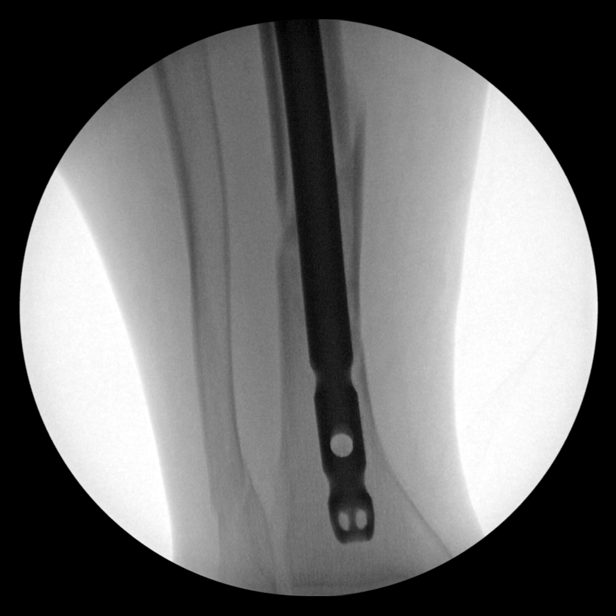
[im 7/12]
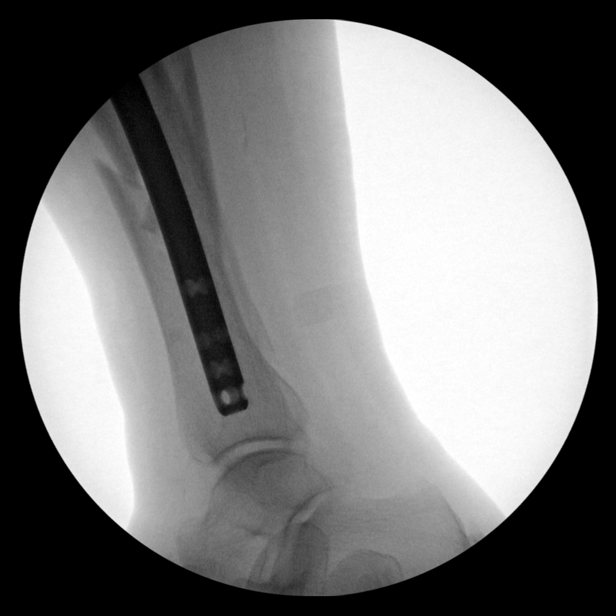
[im 8/12]
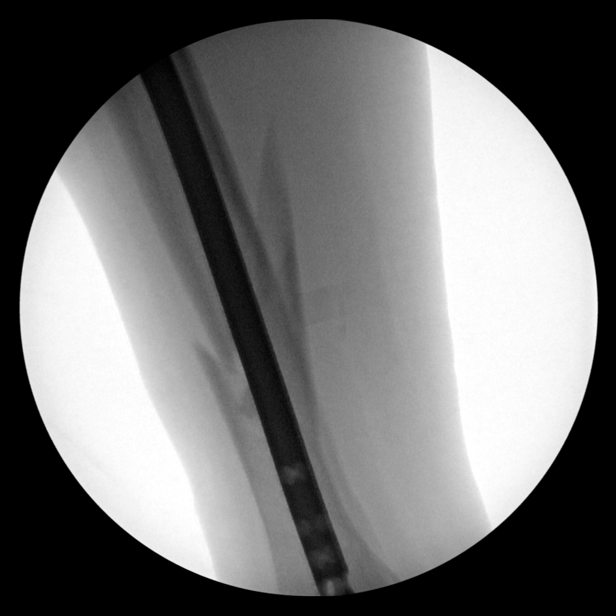
[im 9/12]
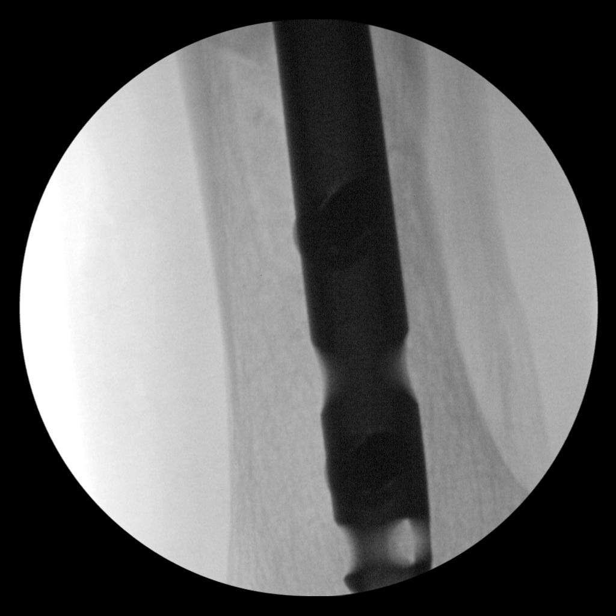
[im 10/12]
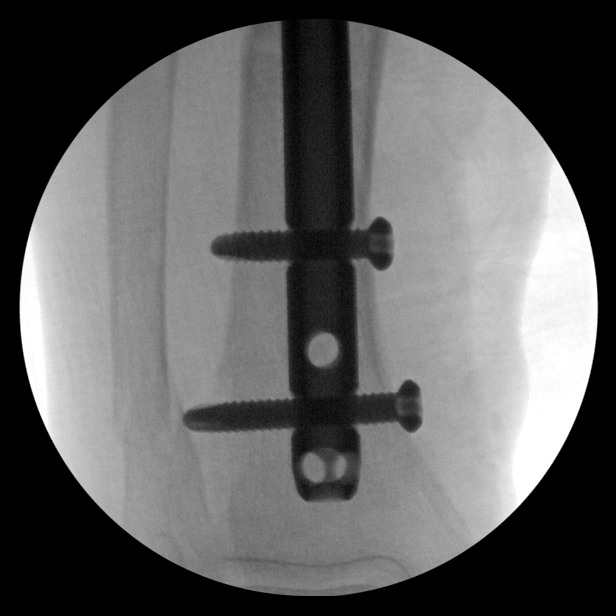
[im 11/12]
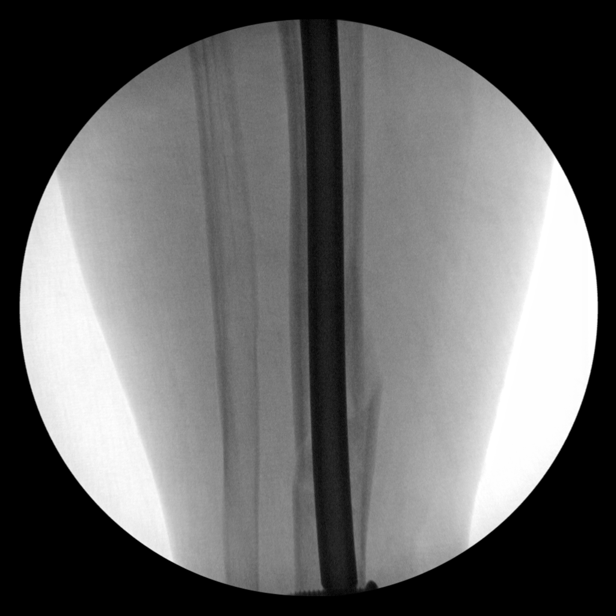
[im 12/12]
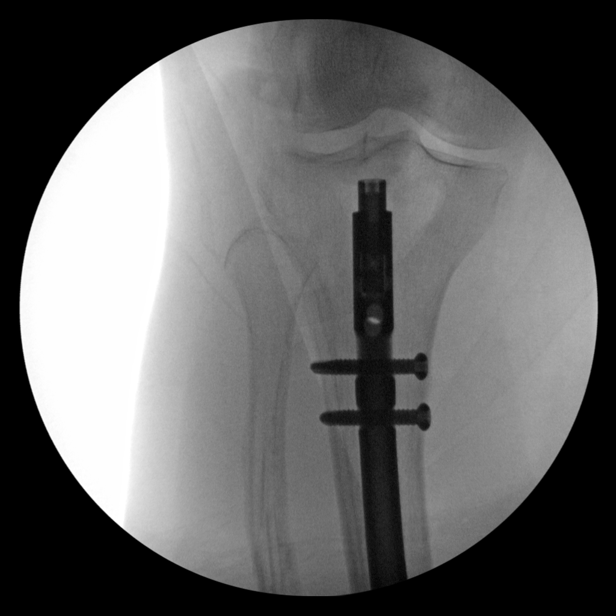

[12 of 12 positions shown; findings below may reference images not displayed]

FINDINGS: Fixation of patient's distal diaphyseal fracture with intramedullary
nail and associated screws. Hardware is intact as there is near
anatomic alignment over the fracture site. Evidence of patient's
known proximal and distal fibular fracture. Recommend correlation
with findings at the time of the procedure.
IMPRESSION: Placement of intramedullary nail bridging patient's distal tibial
diaphyseal fracture with hardware intact and near anatomic alignment
over the fracture site. Evidence of patient's known proximal and
distal fibular fractures.

## 2020-03-18 IMAGING — RF DG C-ARM 1-60 MIN
1 series · 12 of 12 positions shown · IV contrast (agent unspecified)
Comparison: 02/28/2019

CLINICAL DATA: Tibial fractures post fixation.

EXAM:
DG C-ARM 1-60 MIN; RIGHT TIBIA AND FIBULA - 2 VIEW
CONTRAST:  None.
FLUOROSCOPY TIME:  Fluoroscopy Time:  4 minutes 9 seconds.
Radiation Exposure Index (if provided by the fluoroscopic device):
18.82 mGy
Number of Acquired Spot Images: 12

[Series 1: run · 12 of 12 slices shown]
[im 1/12]
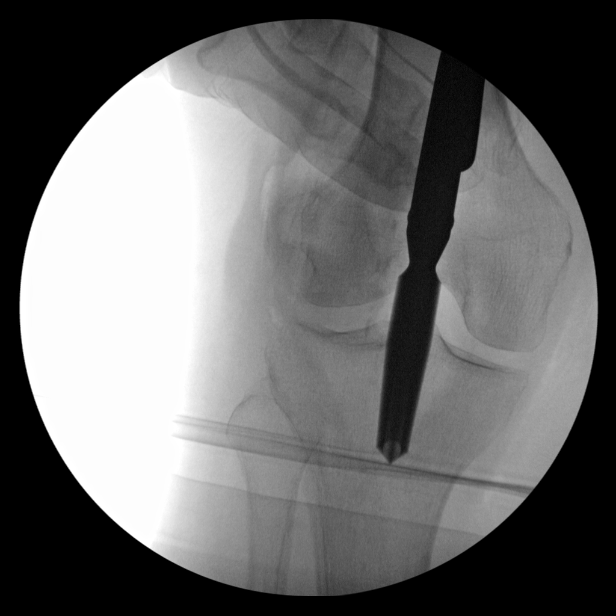
[im 2/12]
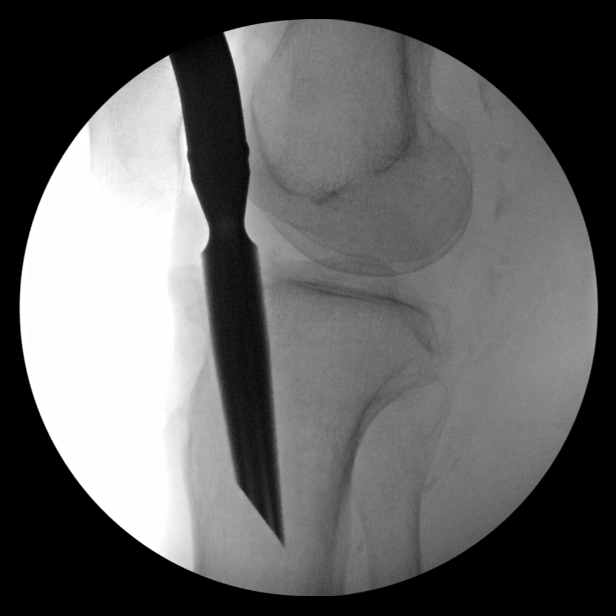
[im 3/12]
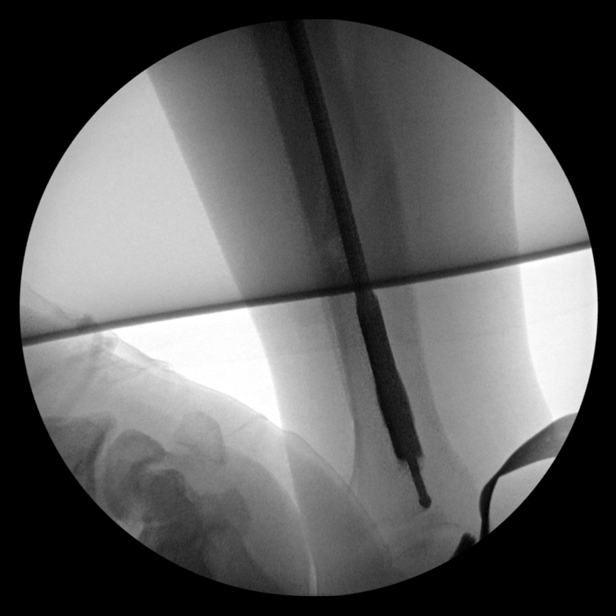
[im 4/12]
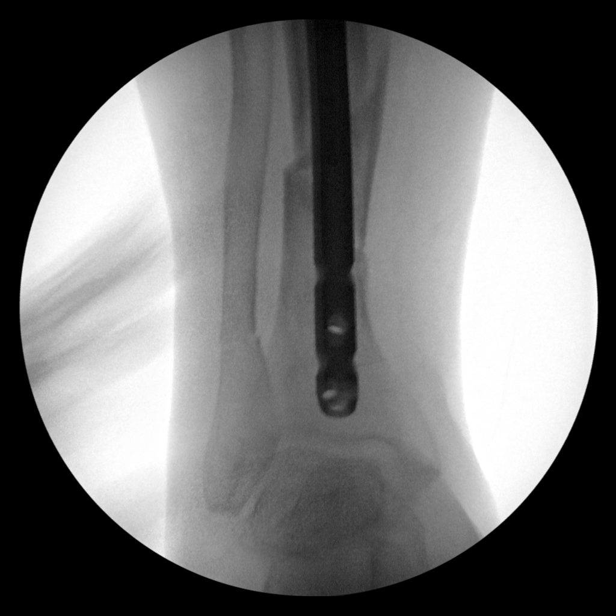
[im 5/12]
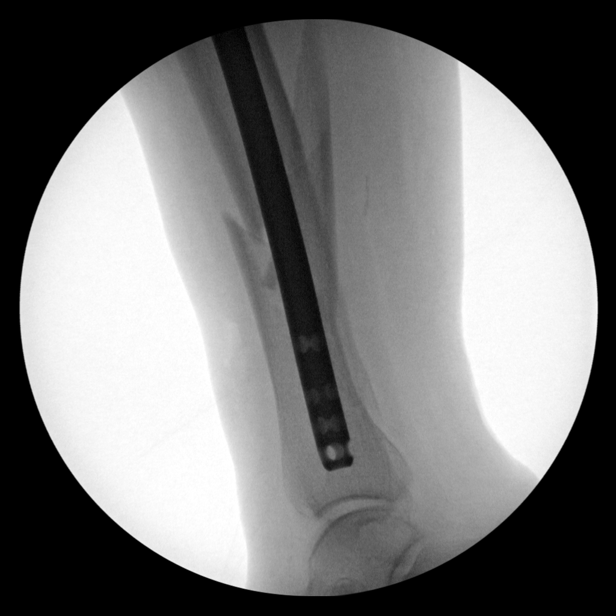
[im 6/12]
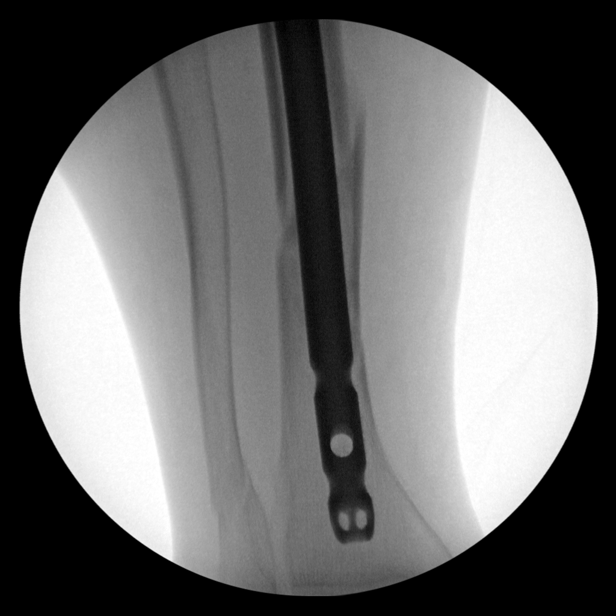
[im 7/12]
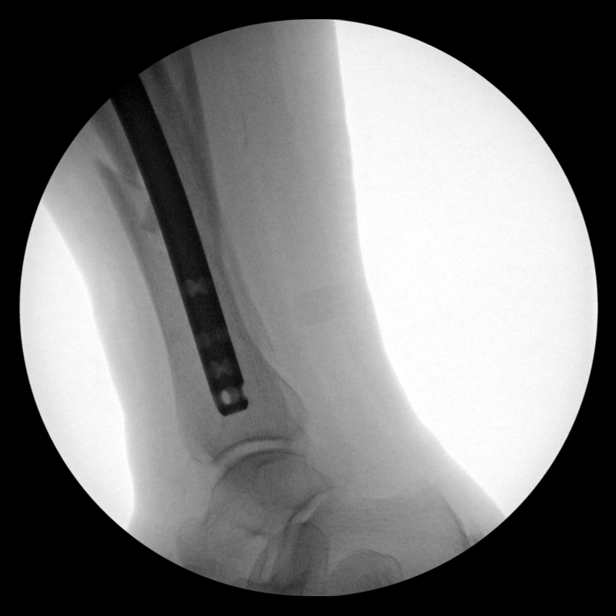
[im 8/12]
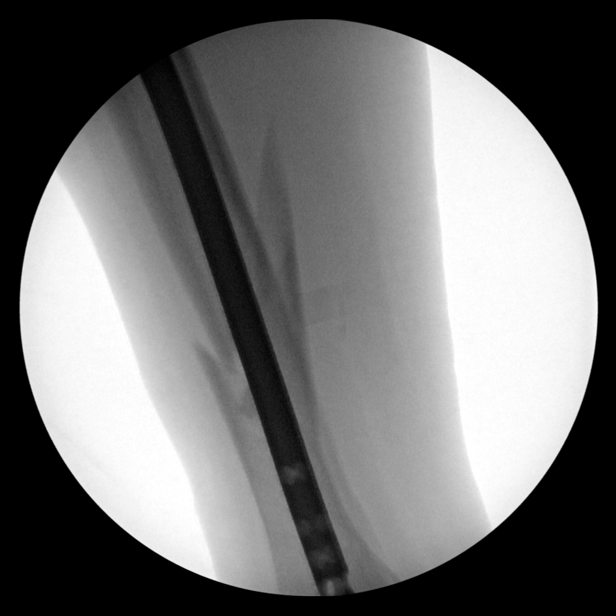
[im 9/12]
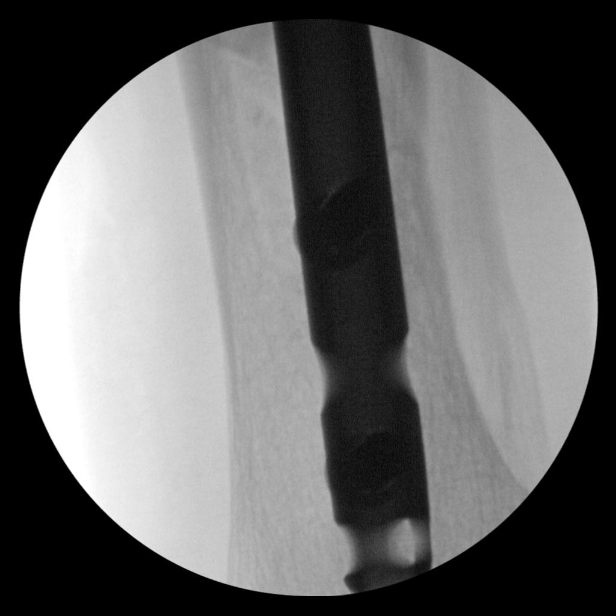
[im 10/12]
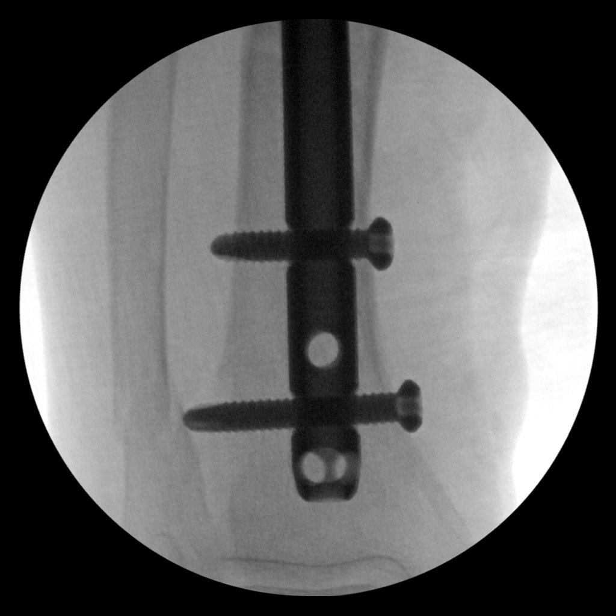
[im 11/12]
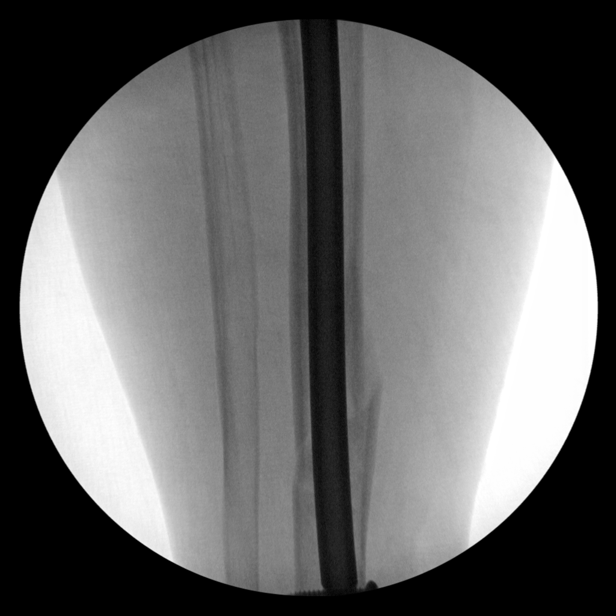
[im 12/12]
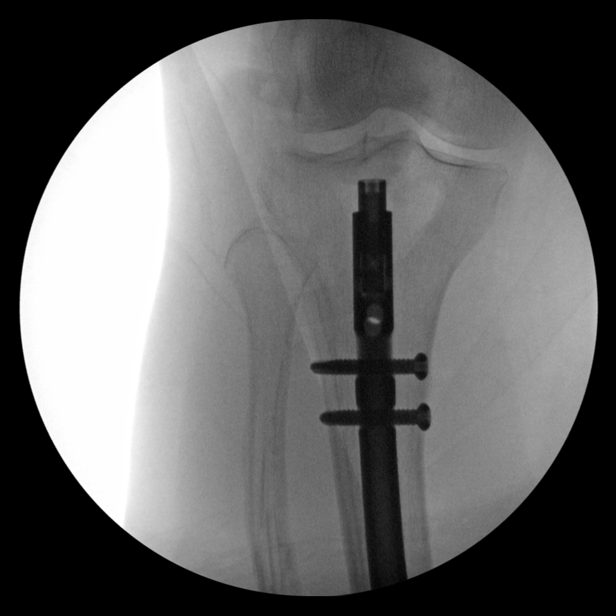

[12 of 12 positions shown; findings below may reference images not displayed]

FINDINGS: Fixation of patient's distal diaphyseal fracture with intramedullary
nail and associated screws. Hardware is intact as there is near
anatomic alignment over the fracture site. Evidence of patient's
known proximal and distal fibular fracture. Recommend correlation
with findings at the time of the procedure.
IMPRESSION: Placement of intramedullary nail bridging patient's distal tibial
diaphyseal fracture with hardware intact and near anatomic alignment
over the fracture site. Evidence of patient's known proximal and
distal fibular fractures.
